# Patient Record
Sex: Female | Born: 1985
Health system: Southern US, Community
[De-identification: ages and names within clinical notes are randomized; demographics above are authoritative.]

## PROBLEM LIST (undated history)

## (undated) DIAGNOSIS — J45909 Unspecified asthma, uncomplicated: Secondary | ICD-10-CM

## (undated) DIAGNOSIS — R011 Cardiac murmur, unspecified: Secondary | ICD-10-CM

## (undated) HISTORY — PX: NO PAST SURGERIES: SHX2092

## (undated) HISTORY — DX: Unspecified asthma, uncomplicated: J45.909

## (undated) HISTORY — DX: Cardiac murmur, unspecified: R01.1

---
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
## 2009-11-27 ENCOUNTER — Ambulatory Visit: Payer: Self-pay | Admitting: Interventional Radiology

## 2009-11-27 ENCOUNTER — Emergency Department (HOSPITAL_BASED_OUTPATIENT_CLINIC_OR_DEPARTMENT_OTHER): Admission: EM | Admit: 2009-11-27 | Discharge: 2009-11-27 | Payer: Self-pay | Admitting: Emergency Medicine

## 2010-06-19 ENCOUNTER — Emergency Department (HOSPITAL_BASED_OUTPATIENT_CLINIC_OR_DEPARTMENT_OTHER)
Admission: EM | Admit: 2010-06-19 | Discharge: 2010-06-19 | Disposition: A | Discharge status: Home / Self Care | Payer: Self-pay | Attending: Emergency Medicine | Admitting: Emergency Medicine

## 2010-06-19 DIAGNOSIS — N898 Other specified noninflammatory disorders of vagina: Secondary | ICD-10-CM | POA: Insufficient documentation

## 2010-06-19 DIAGNOSIS — J45909 Unspecified asthma, uncomplicated: Secondary | ICD-10-CM | POA: Insufficient documentation

## 2010-06-19 LAB — PREGNANCY, URINE: Preg Test, Ur: NEGATIVE

## 2010-06-19 LAB — URINALYSIS, ROUTINE W REFLEX MICROSCOPIC
Nitrite: NEGATIVE
Specific Gravity, Urine: 1.03 (ref 1.005–1.030)
Urobilinogen, UA: 0.2 mg/dL (ref 0.0–1.0)
pH: 6 (ref 5.0–8.0)

## 2010-06-19 LAB — URINE MICROSCOPIC-ADD ON

## 2010-08-14 ENCOUNTER — Emergency Department: Payer: Self-pay | Admitting: Unknown Physician Specialty
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
## 2011-03-20 ENCOUNTER — Emergency Department (HOSPITAL_BASED_OUTPATIENT_CLINIC_OR_DEPARTMENT_OTHER)
Admission: EM | Admit: 2011-03-20 | Discharge: 2011-03-20 | Disposition: A | Discharge status: Home / Self Care | Payer: Self-pay | Attending: Emergency Medicine | Admitting: Emergency Medicine

## 2011-03-20 ENCOUNTER — Encounter (HOSPITAL_BASED_OUTPATIENT_CLINIC_OR_DEPARTMENT_OTHER): Payer: Self-pay | Admitting: *Deleted

## 2011-03-20 DIAGNOSIS — F17200 Nicotine dependence, unspecified, uncomplicated: Secondary | ICD-10-CM | POA: Insufficient documentation

## 2011-03-20 DIAGNOSIS — J029 Acute pharyngitis, unspecified: Secondary | ICD-10-CM | POA: Insufficient documentation

## 2011-03-20 MED ORDER — PENICILLIN V POTASSIUM 500 MG PO TABS: 500.0000 mg | ORAL_TABLET | Freq: Four times a day (QID) | ORAL | Status: AC

## 2011-03-20 NOTE — Discharge Instructions (Signed)
Sore Throat A sore throat is felt inside the throat and at the back of the mouth. It hurts to swallow or the throat may feel dry and scratchy. It can be caused by germs, smoking, pollution, or allergies.  HOME CARE   Only take medicine as told by your doctor.   Drink enough fluids to keep your pee (urine) clear or pale yellow.   Eat soft foods.   Do not smoke.   Rinse the mouth (gargle) with warm water or salt water ( teaspoon salt in 8 ounces of water).   Try throat sprays, lozenges, or suck on hard candy.  GET HELP RIGHT AWAY IF:   You have trouble breathing.   Your sore throat lasts longer than 1 week.   There is more puffiness (swelling) in the throat.   The pain is so bad that you are unable to swallow.   You have a very bad headache or a red rash.   You start to throw up (vomit).   You or your child has a temperature by mouth above 102 F (38.9 C), not controlled by medicine.   Your baby is older than 3 months with a rectal temperature of 102 F (38.9 C) or higher.   Your baby is 32 months old or younger with a rectal temperature of 100.4 F (38 C) or higher.  MAKE SURE YOU:   Understand these instructions.   Will watch your condition.   Will get help right away if you are not doing well or get worse.  Document Released: 10/15/2007 Document Revised: 09/17/2010 Document Reviewed: 10/15/2007 [facility] Patient Information 2012 [place], [place].Sore Throat A sore throat is felt inside the throat and at the back of the mouth. It hurts to swallow or the throat may feel dry and scratchy. It can be caused by germs, smoking, pollution, or allergies.  HOME CARE   Only take medicine as told by your doctor.   Drink enough fluids to keep your pee (urine) clear or pale yellow.   Eat soft foods.   Do not smoke.   Rinse the mouth (gargle) with warm water or salt water ( teaspoon salt in 8 ounces of water).   Try throat sprays, lozenges, or suck on hard candy.  GET  HELP RIGHT AWAY IF:   You have trouble breathing.   Your sore throat lasts longer than 1 week.   There is more puffiness (swelling) in the throat.   The pain is so bad that you are unable to swallow.   You have a very bad headache or a red rash.   You start to throw up (vomit).   You or your child has a temperature by mouth above 102 F (38.9 C), not controlled by medicine.   Your baby is older than 3 months with a rectal temperature of 102 F (38.9 C) or higher.   Your baby is 47 months old or younger with a rectal temperature of 100.4 F (38 C) or higher.  MAKE SURE YOU:   Understand these instructions.   Will watch your condition.   Will get help right away if you are not doing well or get worse.  Document Released: 10/15/2007 Document Revised: 09/17/2010 Document Reviewed: 10/15/2007 [facility] Patient Information 2012 [place], [place].

## 2011-03-20 NOTE — ED Provider Notes (Signed)
History     CSN: [id number]  Arrival date & time 03/20/11  1340   First MD Initiated Contact with Patient 03/20/11 1354      Chief Complaint  Patient presents with  . Sore Throat    (Consider location/radiation/quality/duration/timing/severity/associated sxs/prior treatment) Patient is a 26 y.o. female presenting with pharyngitis. The history is provided by the patient. No language interpreter was used.  Sore Throat This is a new problem. The current episode started in the past 7 days. The problem occurs constantly. The problem has been gradually worsening. The symptoms are aggravated by nothing. She has tried nothing for the symptoms. The treatment provided no relief.   Pt complains of a sorethroat.  Pt reports left tonsil is swollen History reviewed. No pertinent past medical history.  History reviewed. No pertinent past surgical history.  No family history on file.  History  Substance Use Topics  . Smoking status: Current Everyday Smoker -- 0.5 packs/day  . Smokeless tobacco: Not on file  . Alcohol Use: No    OB History    Grav Para Term Preterm Abortions TAB SAB Ect Mult Living                  Review of Systems  All other systems reviewed and are negative.    Allergies  Review of patient's allergies indicates no known allergies.  Home Medications  No current outpatient prescriptions on file.  BP 115/75  Pulse 86  Temp(Src) 98.2 F (36.8 C) (Oral)  Resp 18  Ht 5\' 6"  (1.676 m)  Wt 170 lb (77.111 kg)  BMI 27.44 kg/m2  SpO2 99%  Physical Exam  Vitals reviewed. Constitutional: She appears well-nourished.  HENT:  Head: Normocephalic and atraumatic.  Right Ear: External ear normal.  Left Ear: External ear normal.  Nose: Nose normal.       Swollen left tonsil,  Throat erythematous  Eyes: Conjunctivae and EOM are normal. Pupils are equal, round, and reactive to light.  Neck: Neck supple.  Pulmonary/Chest: Effort normal.  Abdominal: Soft.    Neurological: She is alert.  Skin: Skin is warm.    ED Course  Procedures (including critical care time)   Labs Reviewed  RAPID STREP SCREEN   No results found.   No diagnosis found.    MDM  Strep is negative,  I advised salt water gargles, rx for pcn.  Pt to see her Md for recheck on Monday       Langston Masker, [place] 03/20/11 1438

## 2011-03-20 NOTE — ED Notes (Signed)
Sore throat 2 weeks ago. Got better and 3 days ago came back.

## 2011-03-21 NOTE — ED Provider Notes (Signed)
Medical screening examination/treatment/procedure(s) were performed by non-physician practitioner and as supervising physician I was immediately available for consultation/collaboration.  Cyndra Numbers, MD 03/21/11 1021

## 2014-05-13 ENCOUNTER — Emergency Department
Admit: 2014-05-13 | Disposition: A | Discharge status: Home / Self Care | Payer: Self-pay | Admitting: Emergency Medicine
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
## 2016-05-16 ENCOUNTER — Ambulatory Visit
Admission: EM | Admit: 2016-05-16 | Discharge: 2016-05-16 | Disposition: A | Discharge status: Home / Self Care | Payer: Self-pay | Attending: Family Medicine | Admitting: Family Medicine

## 2016-05-16 DIAGNOSIS — J020 Streptococcal pharyngitis: Secondary | ICD-10-CM

## 2016-05-16 LAB — RAPID STREP SCREEN (MED CTR MEBANE ONLY): STREPTOCOCCUS, GROUP A SCREEN (DIRECT): POSITIVE — AB

## 2016-05-16 MED ORDER — PENICILLIN G BENZATHINE 1200000 UNIT/2ML IM SUSP
1.2000 10*6.[IU] | Freq: Once | INTRAMUSCULAR | Status: AC
  Administered 2016-05-16: 1.2 10*6.[IU] via INTRAMUSCULAR

## 2016-05-16 NOTE — ED Provider Notes (Signed)
CSN: [id number]     Arrival date & time 05/16/16  1526 History   First MD Initiated Contact with Patient 05/16/16 1552     Chief Complaint  Patient presents with  . Sore Throat   (Consider location/radiation/quality/duration/timing/severity/associated sxs/prior Treatment) HPI As is a 31 year old female who is accompanied by her mother presents with the sudden onset of a sore throat mostly on the left side and a fever that started this morning. It was as high as 101.6. Currently is 99.2.Pulse 110. She is having trouble swallowing.       History reviewed. No pertinent past medical history. History reviewed. No pertinent surgical history. Family History  Problem Relation Age of Onset  . Supraventricular tachycardia Mother   . Stroke Father    Social History  Substance Use Topics  . Smoking status: Current Every Day Smoker    Packs/day: 0.50  . Smokeless tobacco: Never Used  . Alcohol use Yes   OB History    No data available     Review of Systems  Constitutional: Positive for activity change, chills and fever.  HENT: Positive for sore throat and trouble swallowing.   Respiratory: Negative for cough.   All other systems reviewed and are negative.   Allergies  Patient has no known allergies.  Home Medications   Prior to Admission medications   Not on File   Meds Ordered and Administered this Visit   Medications  penicillin g benzathine (BICILLIN LA) 1200000 UNIT/2ML injection 1.2 Million Units (1.2 Million Units Intramuscular Given 05/16/16 1605)    BP (!) 142/68 (BP Location: Left Arm)   Pulse (!) 110   Temp 99.2 F (37.3 C) (Oral)   Resp 18   Ht  (1.651 m)   Wt 195 lb (88.5 kg)   LMP 04/25/2016   SpO2 99%   BMI 32.45 kg/m  No data found.   Physical Exam  Constitutional: She is oriented to person, place, and time. She appears well-developed and well-nourished. No distress.  HENT:  Head: Normocephalic.  Right Ear: External ear normal.  Left  Ear: External ear normal.  Left tonsil is enlarged with cobblestone appearance but no exudate is seen. Tenderness along the left throat and shotty nodes in anterior cervical chain.  Eyes: Pupils are equal, round, and reactive to light.  Neck: Normal range of motion.  Pulmonary/Chest: Effort normal and breath sounds normal.  Musculoskeletal: Normal range of motion.  Neurological: She is alert and oriented to person, place, and time.  Skin: Skin is warm and dry. She is not diaphoretic.  Psychiatric: She has a normal mood and affect. Her behavior is normal. Judgment and thought content normal.  Nursing note and vitals reviewed.   Urgent Care Course     Procedures (including critical care time)  Labs Review Labs Reviewed  RAPID STREP SCREEN (NOT AT [facility]) - Abnormal; Notable for the following:       Result Value   Streptococcus, Group A Screen (Direct) POSITIVE (*)    All other components within normal limits    Imaging Review No results found..ed   Visual Acuity Review  Right Eye Distance:   Left Eye Distance:   Bilateral Distance:    Right Eye Near:   Left Eye Near:    Bilateral Near:     Medications  penicillin g benzathine (BICILLIN LA) 1200000 UNIT/2ML injection 1.2 Million Units (1.2 Million Units Intramuscular Given 05/16/16 1605)      MDM   1. Strep throat  Plan: 1. Test/x-ray results and diagnosis reviewed with patient 2. rx as per orders; risks, benefits, potential side effects reviewed with patient 3. Recommend supportive treatment with Motrin for inflammation fever and pain.Salt water gargles or lozenges for comfort. Return to clinic if not feeling improvement. 4. F/u prn if symptomres worsen or don't improve     Lutricia Feil, PA-C 05/16/16 1614

## 2016-05-16 NOTE — ED Triage Notes (Signed)
Patient complains of a sore throat and fever that started this morning.

## 2016-12-01 DIAGNOSIS — A049 Bacterial intestinal infection, unspecified: Secondary | ICD-10-CM | POA: Diagnosis not present

## 2016-12-01 DIAGNOSIS — R197 Diarrhea, unspecified: Secondary | ICD-10-CM | POA: Diagnosis not present

## 2016-12-01 DIAGNOSIS — R1084 Generalized abdominal pain: Secondary | ICD-10-CM | POA: Diagnosis not present

## 2016-12-16 DIAGNOSIS — H5213 Myopia, bilateral: Secondary | ICD-10-CM | POA: Diagnosis not present

## 2017-04-12 DIAGNOSIS — S299XXA Unspecified injury of thorax, initial encounter: Secondary | ICD-10-CM | POA: Diagnosis not present

## 2017-04-26 ENCOUNTER — Ambulatory Visit
Admission: RE | Admit: 2017-04-26 | Discharge: 2017-04-26 | Disposition: A | Discharge status: Home / Self Care | Payer: BLUE CROSS/BLUE SHIELD | Source: Ambulatory Visit | Attending: Family Medicine | Admitting: Family Medicine

## 2017-04-26 ENCOUNTER — Ambulatory Visit: Payer: BLUE CROSS/BLUE SHIELD | Admitting: Family Medicine

## 2017-04-26 ENCOUNTER — Encounter: Payer: Self-pay | Admitting: Family Medicine

## 2017-04-26 ENCOUNTER — Telehealth: Payer: Self-pay

## 2017-04-26 VITALS — BP 122/78 | HR 85 | Temp 97.7°F | Resp 16 | Ht 65.0 in | Wt 208.0 lb

## 2017-04-26 DIAGNOSIS — S83411A Sprain of medial collateral ligament of right knee, initial encounter: Secondary | ICD-10-CM

## 2017-04-26 DIAGNOSIS — X58XXXA Exposure to other specified factors, initial encounter: Secondary | ICD-10-CM | POA: Diagnosis not present

## 2017-04-26 DIAGNOSIS — S8390XA Sprain of unspecified site of unspecified knee, initial encounter: Secondary | ICD-10-CM | POA: Insufficient documentation

## 2017-04-26 DIAGNOSIS — Z720 Tobacco use: Secondary | ICD-10-CM | POA: Insufficient documentation

## 2017-04-26 DIAGNOSIS — J45990 Exercise induced bronchospasm: Secondary | ICD-10-CM | POA: Diagnosis not present

## 2017-04-26 DIAGNOSIS — M25561 Pain in right knee: Secondary | ICD-10-CM | POA: Diagnosis not present

## 2017-04-26 NOTE — Assessment & Plan Note (Signed)
Discussed health risks of continued smoking, importance of cessation Patient not interested in quitting at this time Continue to assess

## 2017-04-26 NOTE — Telephone Encounter (Signed)
Left message advising pt. OK per DPR. 

## 2017-04-26 NOTE — Assessment & Plan Note (Signed)
Currently well controlled without need for albuterol or other medications Continue to monitor

## 2017-04-26 NOTE — Progress Notes (Signed)
Patient: Amanda Nichols, Female    DOB: Sep 01, 1985, 32 y.o.   MRN: [id number] Visit Date: 04/26/2017  Today's Provider: Shirlee Latch, MD   I, Joslyn Hy, CMA, am acting as scribe for Shirlee Latch, MD.  Chief Complaint  Patient presents with  . Establish Care  . Knee Pain   Subjective:    Establish Care Amanda Nichols is a 32 y.o. female who presents today to establish care. She feels fairly well. She reports exercising some, states she walks about 1 mile per day to her horse stall. She reports she is sleeping well.  She was diagnosed with exercise-induced asthma when she was younger.  She used to run track and play basketball.  She would take her albuterol inhaler as needed during activity.  She never had to take a controller medication.  She has not needed albuterol for many years and she is less active than she used to be.  Pt is also c/o right knee pain. This has been present for 1 week. She has tried a brace, ice, heat, IBU all without relief. The knee is swelling x3d. She denies any known injury to the knee, but believes she could have injured it about 3 weeks ago when she injured her chest wall while building a horse stall.  Pain is present in the anterior medial knee.  She finds that her knee gets stiff after sitting for long periods of time.  It is worse with crossing her legs, squatting and hyperextending her leg.  She has been smoking about half pack of cigarettes a day for the last 4 years.  She is not currently interested in quitting.  She knows the consequences of smoking including increased cancer risk, heart disease risk, lung disease risk etc.  She thinks that she may try to quit in the future. -----------------------------------------------------------------   Review of Systems  Constitutional: Negative.   HENT: Negative.   Eyes: Negative.   Respiratory: Negative.   Cardiovascular: Negative.   Gastrointestinal: Negative.     Endocrine: Negative.   Genitourinary: Negative.   Musculoskeletal: Positive for joint swelling. Negative for arthralgias, back pain, gait problem, myalgias, neck pain and neck stiffness.  Skin: Negative.   Allergic/Immunologic: Negative.   Neurological: Negative.   Hematological: Negative.   Psychiatric/Behavioral: Negative.     Social History      She  reports that she has been smoking.  She has been smoking about 0.50 packs per day. She has never used smokeless tobacco. She reports that she drinks alcohol. She reports that she does not use drugs.       Social History   Socioeconomic History  . Marital status: Single    Spouse name: Not on file  . Number of children: 0  . Years of education: 16  . Highest education level: Bachelor's degree (e.g., BA, AB, BS)  Occupational History    Employer: [employer]  Social Needs  . Financial resource strain: Not hard at all  . Food insecurity:    Worry: Never true    Inability: Never true  . Transportation needs:    Medical: No    Non-medical: No  Tobacco Use  . Smoking status: Current Every Day Smoker    Packs/day: 0.50  . Smokeless tobacco: Never Used  . Tobacco comment: started smoking in 2015  Substance and Sexual Activity  . Alcohol use: Yes    Comment: occasional  . Drug use: No  . Sexual activity:  Not on file  Lifestyle  . Physical activity:    Days per week: 7 days    Minutes per session: 10 min  . Stress: Not on file  Relationships  . Social connections:    Talks on phone: Not on file    Gets together: Not on file    Attends religious service: Not on file    Active member of club or organization: Not on file    Attends meetings of clubs or organizations: Not on file    Relationship status: Not on file  Other Topics Concern  . Not on file  Social History Narrative  . Not on file    Past Medical History:  Diagnosis Date  . Asthma   . Heart murmur      There are no active problems to  display for this patient.   Past Surgical History:  Procedure Laterality Date  . NO PAST SURGERIES      Family History        Family Status  Relation Name Status  . Mother  Alive  . Father  Alive  . Mat Uncle  (Not Specified)  . MGF  (Not Specified)  . Brother  Alive  . Neg Hx  (Not Specified)        Her family history includes Cataracts in her maternal grandfather; Colon polyps in her maternal grandfather; Healthy in her brother; Lymphoma in her maternal grandfather; Melanoma in her maternal grandfather; Sleep apnea in her maternal uncle; Stroke in her father; Supraventricular tachycardia in her mother. There is no history of Colon cancer, Breast cancer, Ovarian cancer, or Cervical cancer.      No Known Allergies   Current Outpatient Medications:  .  ibuprofen (ADVIL,MOTRIN) 200 MG tablet, Take 200 mg by mouth every 6 (six) hours as needed., Disp: , Rfl:    Patient Care Team: Patient, No Pcp Per as PCP - General (General Practice)      Objective:   Vitals: BP 122/78 (BP Location: Left Arm, Patient Position: Sitting, Cuff Size: Large)   Pulse 85   Temp 97.7 F (36.5 C) (Oral)   Resp 16   Ht 5\' 5"  (1.651 m)   Wt 208 lb (94.3 kg)   LMP 04/07/2017   SpO2 98%   BMI 34.61 kg/m    Vitals:   04/26/17 0913  BP: 122/78  Pulse: 85  Resp: 16  Temp: 97.7 F (36.5 C)  TempSrc: Oral  SpO2: 98%  Weight: 208 lb (94.3 kg)  Height: 5\' 5"  (1.651 m)     Physical Exam  Constitutional: She is oriented to person, place, and time. She appears well-developed and well-nourished. No distress.  HENT:  Head: Normocephalic and atraumatic.  Right Ear: External ear normal.  Left Ear: External ear normal.  Nose: Nose normal.  Mouth/Throat: Oropharynx is clear and moist.  Eyes: Pupils are equal, round, and reactive to light. Conjunctivae and EOM are normal. No scleral icterus.  Neck: Neck supple. No thyromegaly present.  Cardiovascular: Normal rate, regular rhythm, normal heart  sounds and intact distal pulses.  No murmur heard. Pulmonary/Chest: Breath sounds normal. No respiratory distress. She has no wheezes. She has no rales.  Abdominal: Soft. Bowel sounds are normal. She exhibits no distension. There is no tenderness. There is no rebound and no guarding.  Musculoskeletal: Normal range of motion. She exhibits no edema or deformity.       Right knee: She exhibits normal range of motion, no swelling, no effusion, no  ecchymosis, no deformity, no erythema, normal alignment, no LCL laxity, normal patellar mobility and no MCL laxity. Tenderness found. Medial joint line and MCL tenderness noted. No lateral joint line, no LCL and no patellar tendon tenderness noted.  Ligaments with solid endpoints.  Negative anterior and posterior drawer.  Negative Thessaly's.  Lymphadenopathy:    She has no cervical adenopathy.  Neurological: She is alert and oriented to person, place, and time.  Skin: Skin is warm and dry. No rash noted.  Psychiatric: She has a normal mood and affect. Her behavior is normal.  Vitals reviewed.    Depression Screen PHQ 2/9 Scores 04/26/2017  PHQ - 2 Score 0      Assessment & Plan:    Problem List Items Addressed This Visit      Respiratory   Exercise-induced asthma    Currently well controlled without need for albuterol or other medications Continue to monitor        Musculoskeletal and Integument   Knee sprain - Primary    Exam consistent with medial knee sprain Given ability to perform Thessaly's and no knee instability, doubt meniscal injury, especially without known mechanism of injury Obtain x-rays Advised on bracing, ice, rest, anti-inflammatories Home exercise program given Return precautions discussed If continues to be bothersome or worsens, consider orthopedic referral      Relevant Orders   DG Knee Complete 4 Views Right (Completed)     Other   Tobacco abuse    Discussed health risks of continued smoking, importance of  cessation Patient not interested in quitting at this time Continue to assess          Return in about 6 months (around 10/26/2017) for physical.   The entirety of the information documented in the History of Present Illness, Review of Systems and Physical Exam were personally obtained by me. Portions of this information were initially documented by Irving Burton Ratchford, CMA and reviewed by me for thoroughness and accuracy.    Erasmo Downer, MD, MPH [facility] 04/26/2017 10:22 AM

## 2017-04-26 NOTE — Assessment & Plan Note (Signed)
Exam consistent with medial knee sprain Given ability to perform Thessaly's and no knee instability, doubt meniscal injury, especially without known mechanism of injury Obtain x-rays Advised on bracing, ice, rest, anti-inflammatories Home exercise program given Return precautions discussed If continues to be bothersome or worsens, consider orthopedic referral

## 2017-04-26 NOTE — Telephone Encounter (Signed)
-----   Message from Erasmo DownerAngela M Bacigalupo, MD sent at 04/26/2017 10:23 AM EDT ----- Normal knee Lottie RaterXRays  Bacigalupo, Angela M, MD, MPH [facility] 04/26/2017 10:23 AM

## 2017-04-30 ENCOUNTER — Telehealth: Payer: Self-pay | Admitting: Family Medicine

## 2017-04-30 DIAGNOSIS — S83411A Sprain of medial collateral ligament of right knee, initial encounter: Secondary | ICD-10-CM

## 2017-04-30 NOTE — Telephone Encounter (Signed)
Patient is requesting an orthopedic referral to Dr. Eulah PontMurphy, Dr. Thurston HoleWainer or Dr. Dion SaucierLandau for her knee pain.  She said its not any better and in fact "its worse".

## 2017-04-30 NOTE — Telephone Encounter (Signed)
Pt advised.

## 2017-04-30 NOTE — Telephone Encounter (Signed)
Please advise. Thanks.  

## 2017-04-30 NOTE — Telephone Encounter (Signed)
Referral placed. Let patient know that she will be contacted about scheduling an appt, likely some time next week.  Erasmo DownerBacigalupo, Angela M, MD, MPH [facility] 04/30/2017 11:35 AM

## 2017-11-10 ENCOUNTER — Encounter: Payer: Self-pay | Admitting: Family Medicine

## 2018-05-25 ENCOUNTER — Telehealth: Payer: Self-pay

## 2018-05-25 NOTE — Telephone Encounter (Signed)
LMTCB for PHQ screening 

## 2018-07-02 IMAGING — CR DG KNEE COMPLETE 4+V*R*
1 series · 4 of 4 positions shown · non-contrast
Comparison: None.

CLINICAL DATA: Pain for 1 week

EXAM:
RIGHT KNEE - COMPLETE 4+ VIEW

[Series 1: dg knee complete 4 views right · 0.14mm/px · 4 of 4 slices shown]
[im 1/4]
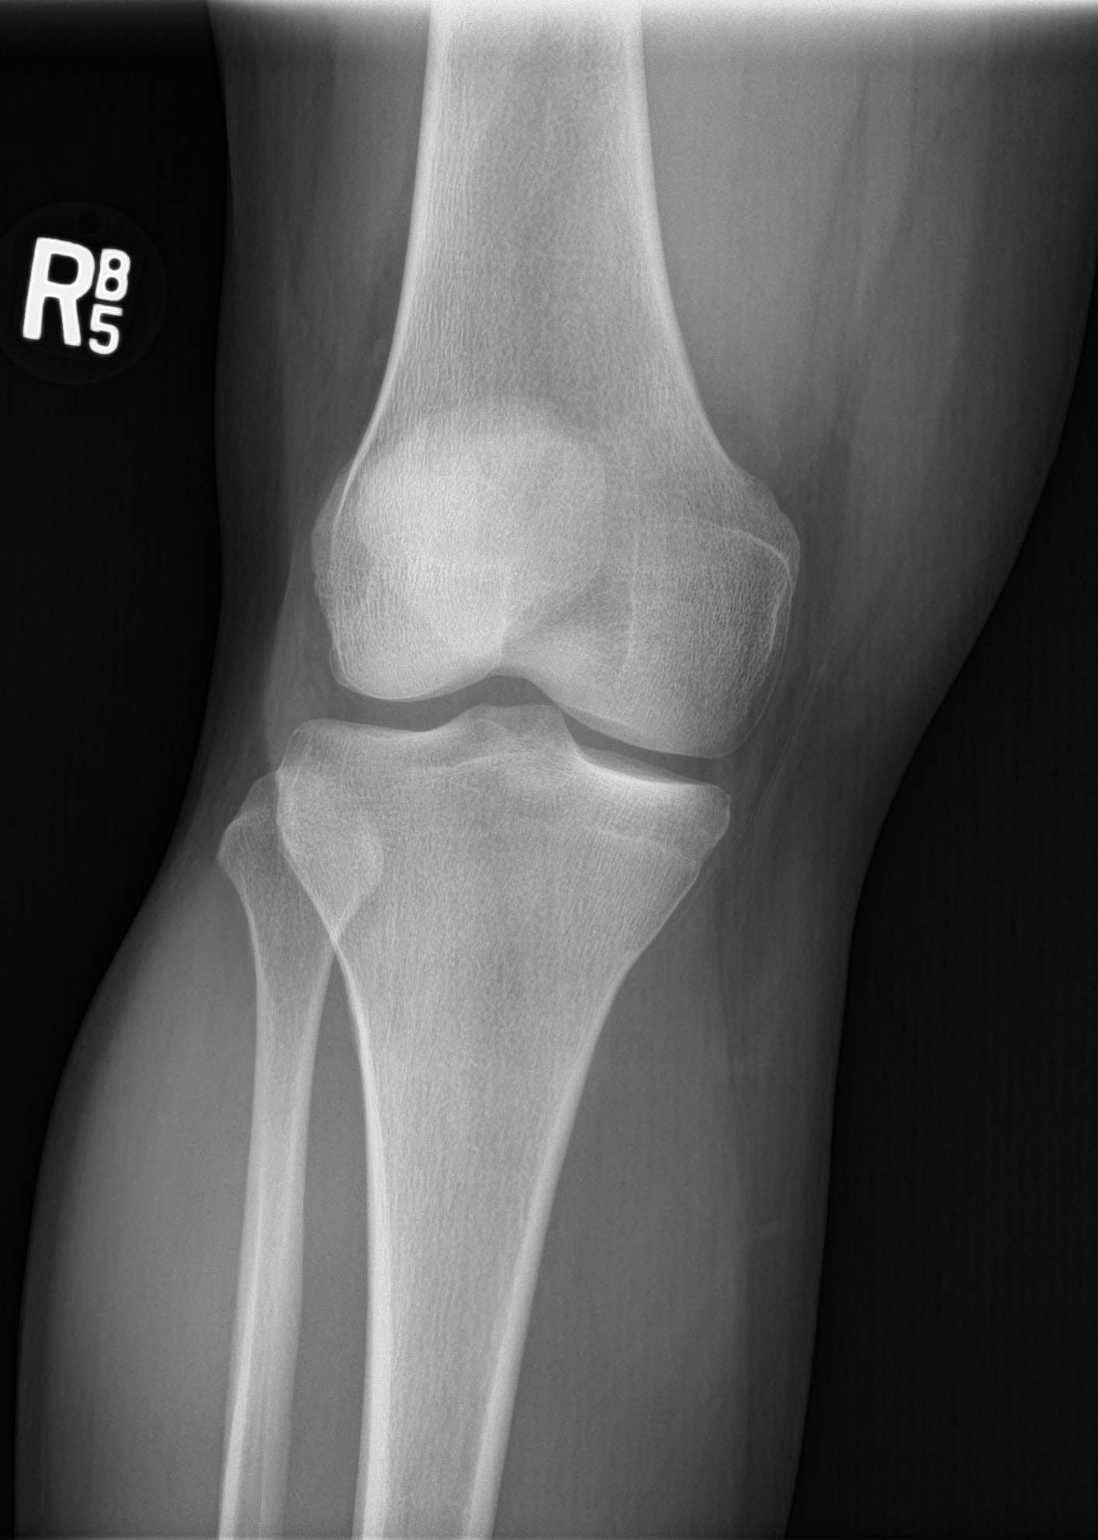
[im 2/4]
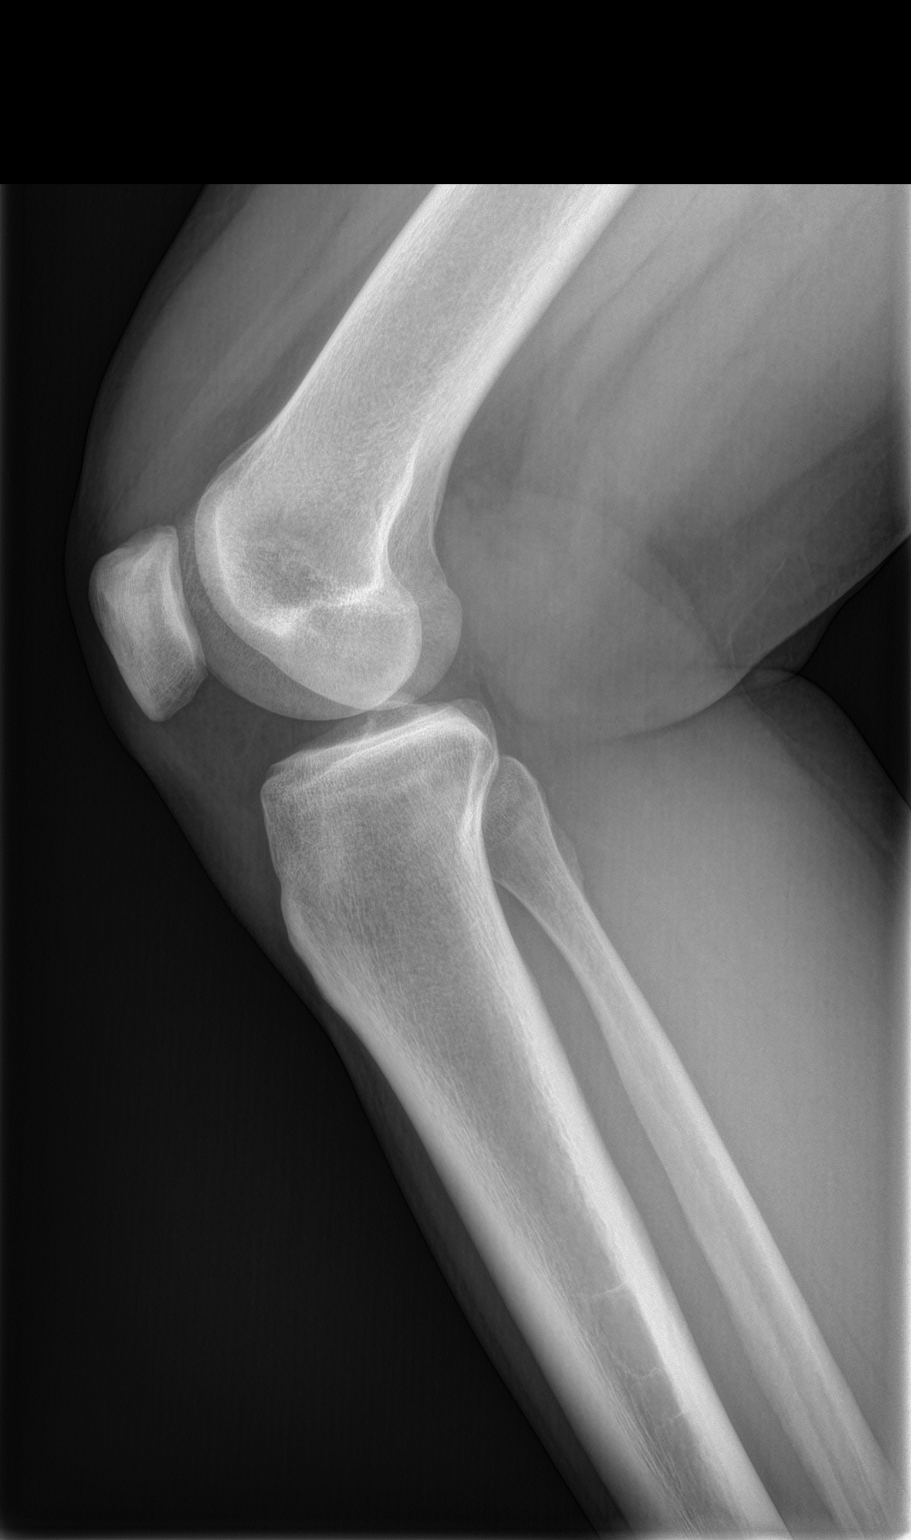
[im 3/4]
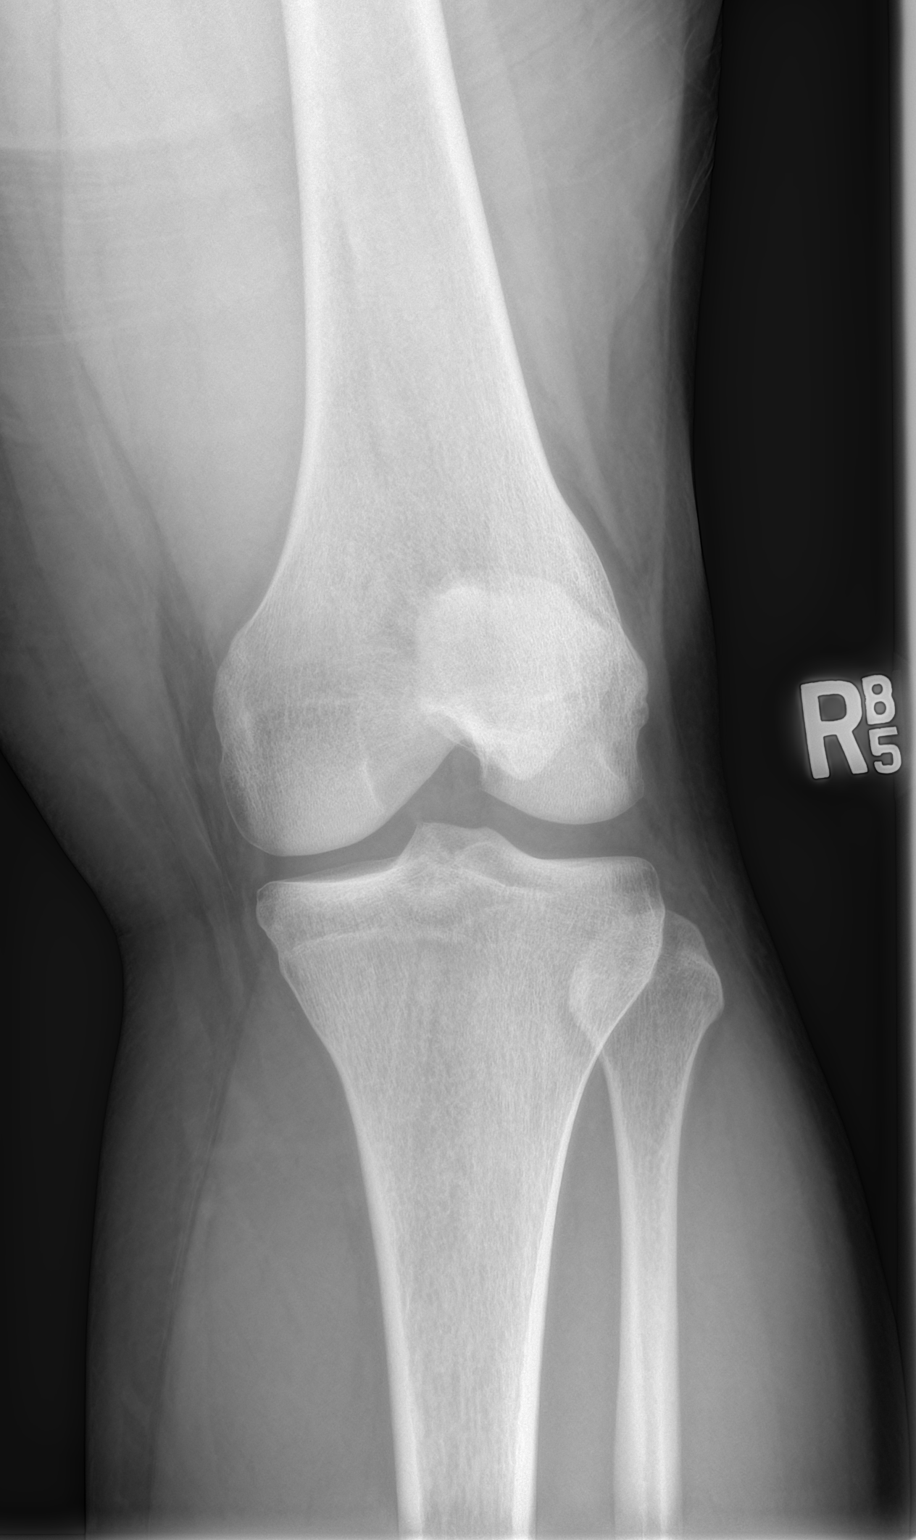
[im 4/4]
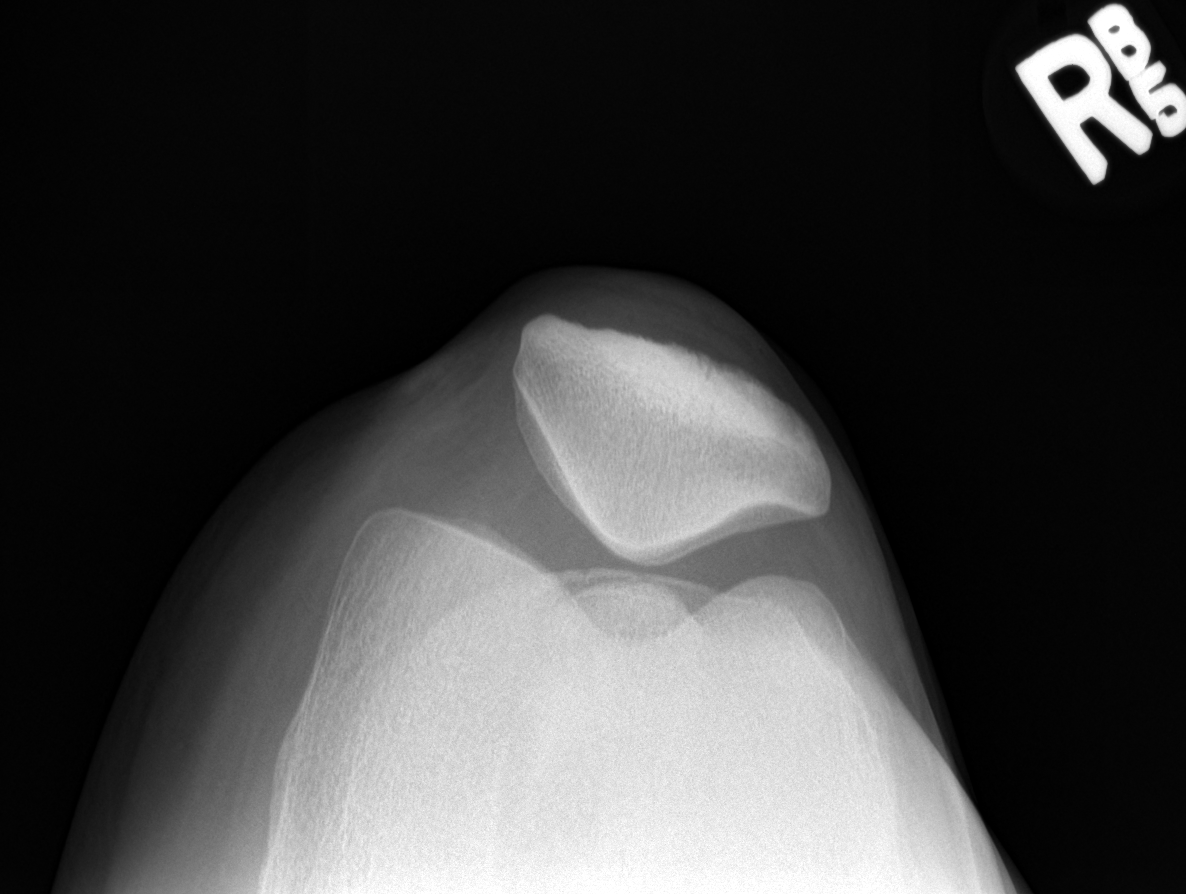

[4 of 4 positions shown; findings below may reference images not displayed]

FINDINGS: Frontal, tunnel, lateral, and sunrise patellar images obtained.
There is no evident fracture or dislocation. No joint effusion. The
joint spaces appear normal. No erosive change.
IMPRESSION: No fracture or joint effusion.  No evident arthropathy.

## 2018-08-24 ENCOUNTER — Other Ambulatory Visit: Payer: Self-pay

## 2018-08-24 DIAGNOSIS — Z20822 Contact with and (suspected) exposure to covid-19: Secondary | ICD-10-CM

## 2018-08-25 LAB — NOVEL CORONAVIRUS, NAA: SARS-CoV-2, NAA: NOT DETECTED

## 2018-09-02 ENCOUNTER — Emergency Department
Admission: EM | Admit: 2018-09-02 | Discharge: 2018-09-02 | Disposition: A | Discharge status: Home / Self Care | Payer: No Typology Code available for payment source | Attending: Emergency Medicine | Admitting: Emergency Medicine

## 2018-09-02 ENCOUNTER — Other Ambulatory Visit: Payer: Self-pay

## 2018-09-02 DIAGNOSIS — Z2914 Encounter for prophylactic rabies immune globin: Secondary | ICD-10-CM | POA: Diagnosis not present

## 2018-09-02 DIAGNOSIS — Y92531 Health care provider office as the place of occurrence of the external cause: Secondary | ICD-10-CM | POA: Insufficient documentation

## 2018-09-02 DIAGNOSIS — F17210 Nicotine dependence, cigarettes, uncomplicated: Secondary | ICD-10-CM | POA: Diagnosis not present

## 2018-09-02 DIAGNOSIS — Y990 Civilian activity done for income or pay: Secondary | ICD-10-CM | POA: Insufficient documentation

## 2018-09-02 DIAGNOSIS — W5501XA Bitten by cat, initial encounter: Secondary | ICD-10-CM | POA: Diagnosis not present

## 2018-09-02 DIAGNOSIS — Z203 Contact with and (suspected) exposure to rabies: Secondary | ICD-10-CM

## 2018-09-02 DIAGNOSIS — J45909 Unspecified asthma, uncomplicated: Secondary | ICD-10-CM | POA: Diagnosis not present

## 2018-09-02 DIAGNOSIS — S61452A Open bite of left hand, initial encounter: Secondary | ICD-10-CM | POA: Diagnosis not present

## 2018-09-02 DIAGNOSIS — Z23 Encounter for immunization: Secondary | ICD-10-CM | POA: Insufficient documentation

## 2018-09-02 DIAGNOSIS — Y93K9 Activity, other involving animal care: Secondary | ICD-10-CM | POA: Insufficient documentation

## 2018-09-02 MED ORDER — RABIES IMMUNE GLOBULIN 150 UNIT/ML IM INJ
20.0000 [IU]/kg | INJECTION | Freq: Once | INTRAMUSCULAR | Status: AC
  Administered 2018-09-02: 12:00:00 1650 [IU] via INTRAMUSCULAR
  Filled 2018-09-02: qty 11

## 2018-09-02 MED ORDER — AMOXICILLIN-POT CLAVULANATE 875-125 MG PO TABS: 1.0000 | ORAL_TABLET | Freq: Two times a day (BID) | ORAL | 0 refills | Status: DC

## 2018-09-02 MED ORDER — RABIES VACCINE, PCEC IM SUSR
1.0000 mL | Freq: Once | INTRAMUSCULAR | Status: AC
  Administered 2018-09-02: 1 mL via INTRAMUSCULAR
  Filled 2018-09-02: qty 1

## 2018-09-02 MED ORDER — RABIES IMMUNE GLOBULIN 150 UNIT/ML IM INJ: 20.0000 [IU]/kg | INJECTION | Freq: Once | INTRAMUSCULAR | Status: DC

## 2018-09-02 NOTE — ED Notes (Signed)
See triage note  Presents with cat bite to hand on Tuesday   States this was unprovoked bite   States the cat was tested for possible rabies    She was coupleof open wounds to hand

## 2018-09-02 NOTE — ED Triage Notes (Signed)
Pt was bitten by cat at work Tuesday to right and left hand. Questionable if cat is up to date on rabies shot, currently being tested.  Pt wants to file workers comp-works for [employer] alert and oriented X4, active, cooperative, pt in NAD. RR even and unlabored, color WNL.

## 2018-09-02 NOTE — ED Provider Notes (Signed)
[facility] Emergency Department Provider Note  ____________________________________________  Time seen: Approximately 11:04 AM  I have reviewed the triage vital signs and the nursing notes.   HISTORY  Chief Complaint Animal Bite   HPI Amanda Nichols is a 33 y.o. female who presents to the emergency department for treatment and evaluation of hand pain after being bit by a cat while at work. Right fifth digit is swollen. Bite on left hand seems to be healing well. Cat was tested for Rabies. Results are not back. Some relief with ibuprofen. Incident occurred on Tuesday.   Past Medical History:  Diagnosis Date  . Asthma   . Heart murmur     Patient Active Problem List   Diagnosis Date Noted  . Knee sprain 04/26/2017  . Exercise-induced asthma 04/26/2017  . Tobacco abuse 04/26/2017    Past Surgical History:  Procedure Laterality Date  . NO PAST SURGERIES      Prior to Admission medications   Medication Sig Start Date End Date Taking? Authorizing Provider  amoxicillin-clavulanate (AUGMENTIN) 875-125 MG tablet Take 1 tablet by mouth 2 (two) times daily. 09/02/18   Tyqwan Pink B, FNP  ibuprofen (ADVIL,MOTRIN) 200 MG tablet Take 400 mg by mouth every 6 (six) hours as needed.    [provider]    Allergies Patient has no known allergies.  Family History  Problem Relation Age of Onset  . Supraventricular tachycardia Mother   . Hypertension Mother   . Stroke Father   . Sleep apnea Maternal Uncle   . Lymphoma Maternal Grandfather   . Melanoma Maternal Grandfather   . Cataracts Maternal Grandfather   . Colon polyps Maternal Grandfather   . Healthy Brother   . Colon cancer Neg Hx   . Breast cancer Neg Hx   . Ovarian cancer Neg Hx   . Cervical cancer Neg Hx     Social History Social History   Tobacco Use  . Smoking status: Current Every Day Smoker    Packs/day: 0.50    Years: 4.00    Pack years: 2.00    Types: Cigarettes   . Smokeless tobacco: Never Used  . Tobacco comment: started smoking in 2015  Substance Use Topics  . Alcohol use: Yes    Comment: occasional, ~1x/ month  . Drug use: No    Review of Systems  Constitutional: Negative for fever. Respiratory: Negative for cough or shortness of breath.  Musculoskeletal: Negative for myalgias Skin: Positive for wounds to left and right hand. Neurological: Negative for numbness or paresthesias. ____________________________________________   PHYSICAL EXAM:  VITAL SIGNS: ED Triage Vitals  Enc Vitals Group     BP 09/02/18 1027 (!) 136/93     Pulse Rate 09/02/18 1027 73     Resp 09/02/18 1027 18     Temp 09/02/18 1027 98 F (36.7 C)     Temp Source 09/02/18 1027 Oral     SpO2 09/02/18 1027 100 %     Weight 09/02/18 1028 180 lb (81.6 kg)     Height 09/02/18 1028 5\' 5"  (1.651 m)     Head Circumference --      Peak Flow --      Pain Score 09/02/18 1028 0     Pain Loc --      Pain Edu? --      Excl. in [place]? --      Constitutional: Well appearing. Eyes: Conjunctivae are clear without discharge or drainage. Nose: No rhinorrhea noted. Mouth/Throat:  Airway is patent.  Neck: No stridor. Unrestricted range of motion observed. Cardiovascular: Capillary refill is <3 seconds.  Respiratory: Respirations are even and unlabored.. Musculoskeletal: Unrestricted range of motion observed. Neurologic: Awake, alert, and oriented x 4.  Skin: Small puncture wound on the left  ____________________________________________   LABS (all labs ordered are listed, but only abnormal results are displayed)  Labs Reviewed - No data to display ____________________________________________  EKG  Not indicated. ____________________________________________  RADIOLOGY   ____________________________________________   PROCEDURES  Procedures ____________________________________________   INITIAL IMPRESSION / ASSESSMENT AND PLAN / ED COURSE  Amanda MoatsLatasha Nicole  Nichols is a 33 y.o. female who presents to the emergency department after being bitten by a cat 3 days ago.  Patient states that the day she was bitten by the cat it was acting fairly normal, but the owner brought the cat back in the next day and it was acting crazy.  She states that it had gone blind and was having seizures and unable to control its movements.  She will be treated with rabies immunoglobulin and the vaccination.  For the actual puncture wounds, she will be treated with Augmentin.  She was provided the schedule for the return dates for the remainder of the vaccinations.  Medications  rabies vaccine (RABAVERT) injection 1 mL (1 mL Intramuscular Given 09/02/18 1157)  rabies immune globulin (HYPERAB/KEDRAB) injection 1,650 Units (1,650 Units Intramuscular Given 09/02/18 1156)     Pertinent labs & imaging results that were available during my care of the patient were reviewed by me and considered in my medical decision making (see chart for details).  ____________________________________________   FINAL CLINICAL IMPRESSION(S) / ED DIAGNOSES  Final diagnoses:  Cat bite, initial encounter  Need for post exposure prophylaxis for rabies    ED Discharge Orders         Ordered    amoxicillin-clavulanate (AUGMENTIN) 875-125 MG tablet  2 times daily     09/02/18 1257           Note:  This document was prepared using Dragon voice recognition software and may include unintentional dictation errors.   Chinita Pesterriplett, Emony Dormer B, FNP 09/02/18 1523    Sharyn CreamerQuale, Mark, MD 09/02/18 [phone]

## 2018-09-02 NOTE — Discharge Instructions (Addendum)
You will need to return on the 17, 21, 28 for the remainder of the Rabies series.

## 2018-09-05 ENCOUNTER — Emergency Department
Admission: EM | Admit: 2018-09-05 | Discharge: 2018-09-05 | Disposition: A | Discharge status: Home / Self Care | Payer: No Typology Code available for payment source | Attending: Student in an Organized Health Care Education/Training Program | Admitting: Student in an Organized Health Care Education/Training Program

## 2018-09-05 ENCOUNTER — Encounter: Payer: Self-pay | Admitting: Emergency Medicine

## 2018-09-05 ENCOUNTER — Other Ambulatory Visit: Payer: Self-pay

## 2018-09-05 DIAGNOSIS — Z23 Encounter for immunization: Secondary | ICD-10-CM | POA: Insufficient documentation

## 2018-09-05 DIAGNOSIS — J45909 Unspecified asthma, uncomplicated: Secondary | ICD-10-CM | POA: Diagnosis not present

## 2018-09-05 DIAGNOSIS — F17210 Nicotine dependence, cigarettes, uncomplicated: Secondary | ICD-10-CM | POA: Insufficient documentation

## 2018-09-05 MED ORDER — RABIES VACCINE, PCEC IM SUSR
1.0000 mL | Freq: Once | INTRAMUSCULAR | Status: AC
  Administered 2018-09-05: 18:00:00 1 mL via INTRAMUSCULAR
  Filled 2018-09-05: qty 1

## 2018-09-05 NOTE — ED Provider Notes (Signed)
[facility] Emergency Department Provider Note ____________________________________________  Time seen: 1735  I have reviewed the triage vital signs and the nursing notes.  HISTORY  Chief Complaint  Rabies Injection  HPI Amanda Nichols is a 33 y.o. female presents to the ED for her second of 4 rabies vaccines.  Patient denies any interim complaints.  Past Medical History:  Diagnosis Date  . Asthma   . Heart murmur     Patient Active Problem List   Diagnosis Date Noted  . Knee sprain 04/26/2017  . Exercise-induced asthma 04/26/2017  . Tobacco abuse 04/26/2017    Past Surgical History:  Procedure Laterality Date  . NO PAST SURGERIES      Prior to Admission medications   Medication Sig Start Date End Date Taking? Authorizing Provider  amoxicillin-clavulanate (AUGMENTIN) 875-125 MG tablet Take 1 tablet by mouth 2 (two) times daily. 09/02/18   Triplett, Cari B, FNP  ibuprofen (ADVIL,MOTRIN) 200 MG tablet Take 400 mg by mouth every 6 (six) hours as needed.    [provider]    Allergies Patient has no known allergies.  Family History  Problem Relation Age of Onset  . Supraventricular tachycardia Mother   . Hypertension Mother   . Stroke Father   . Sleep apnea Maternal Uncle   . Lymphoma Maternal Grandfather   . Melanoma Maternal Grandfather   . Cataracts Maternal Grandfather   . Colon polyps Maternal Grandfather   . Healthy Brother   . Colon cancer Neg Hx   . Breast cancer Neg Hx   . Ovarian cancer Neg Hx   . Cervical cancer Neg Hx     Social History Social History   Tobacco Use  . Smoking status: Current Every Day Smoker    Packs/day: 0.50    Years: 4.00    Pack years: 2.00    Types: Cigarettes  . Smokeless tobacco: Never Used  . Tobacco comment: started smoking in 2015  Substance Use Topics  . Alcohol use: Yes    Comment: occasional, ~1x/ month  . Drug use: No    Review of Systems  Constitutional: Negative  for fever. Eyes: Negative for visual changes. ENT: Negative for sore throat. Cardiovascular: Negative for chest pain. Respiratory: Negative for shortness of breath. Gastrointestinal: Negative for abdominal pain, vomiting and diarrhea. Genitourinary: Negative for dysuria. Musculoskeletal: Negative for back pain. Skin: Negative for rash. Neurological: Negative for headaches, focal weakness or numbness. ____________________________________________  PHYSICAL EXAM:  VITAL SIGNS: ED Triage Vitals  Enc Vitals Group     BP 09/05/18 1726 137/68     Pulse Rate 09/05/18 1726 75     Resp 09/05/18 1726 18     Temp 09/05/18 1726 98 F (36.7 C)     Temp Source 09/05/18 1726 Oral     SpO2 09/05/18 1726 100 %     Weight --      Height --      Head Circumference --      Peak Flow --      Pain Score 09/05/18 1713 0     Pain Loc --      Pain Edu? --      Excl. in [place]? --     Constitutional: Alert and oriented. Well appearing and in no distress. Head: Normocephalic and atraumatic. Eyes: Conjunctivae are normal. Normal extraocular movements Cardiovascular: Normal rate, regular rhythm. Normal distal pulses. Respiratory: Normal respiratory effort. No wheezes/rales/rhonchi. Musculoskeletal: Nontender with normal range of motion in all extremities.  Neurologic:  Normal gait without ataxia. Normal speech and language. No gross focal neurologic deficits are appreciated. Skin:  Skin is warm, dry and intact. No rash noted. Psychiatric: Mood and affect are normal. Patient exhibits appropriate insight and judgment. ____________________________________________  PROCEDURES  Procedures Rabavert 1 ml IM ____________________________________________  INITIAL IMPRESSION / ASSESSMENT AND PLAN / ED COURSE  Amanda Nichols was evaluated in Emergency Department on 09/05/2018 for the symptoms described in the history of present illness. She was evaluated in the context of the global COVID-19 pandemic,  which necessitated consideration that the patient might be at risk for infection with the SARS-CoV-2 virus that causes COVID-19. Institutional protocols and algorithms that pertain to the evaluation of patients at risk for COVID-19 are in a state of rapid change based on information released by regulatory bodies including the CDC and federal and state organizations. These policies and algorithms were followed during the patient's care in the ED.  Patient with subsequent ED visit for continuation of the rabies vaccine series.  She denies any interim complaints.  She received her second of 4 vaccines today patient will return to the ED as scheduled for the remainder of the series. ____________________________________________  FINAL CLINICAL IMPRESSION(S) / ED DIAGNOSES  Final diagnoses:  Encounter for repeat administration of rabies vaccination      Lissa HoardMenshew, Anwyn Kriegel V Bacon, PA-C 09/05/18 1740    Willy Eddyobinson, Patrick, MD 09/05/18 1816

## 2018-09-05 NOTE — ED Triage Notes (Signed)
Here for rabies vaccine

## 2018-09-05 NOTE — Discharge Instructions (Addendum)
Return to the ED as scheduled for your 3rd vaccine.

## 2018-09-09 ENCOUNTER — Other Ambulatory Visit: Payer: Self-pay

## 2018-09-09 ENCOUNTER — Emergency Department
Admission: EM | Admit: 2018-09-09 | Discharge: 2018-09-09 | Disposition: A | Discharge status: Home / Self Care | Payer: BC Managed Care – PPO | Attending: Emergency Medicine | Admitting: Emergency Medicine

## 2018-09-09 DIAGNOSIS — F17210 Nicotine dependence, cigarettes, uncomplicated: Secondary | ICD-10-CM | POA: Insufficient documentation

## 2018-09-09 DIAGNOSIS — J45909 Unspecified asthma, uncomplicated: Secondary | ICD-10-CM | POA: Diagnosis not present

## 2018-09-09 DIAGNOSIS — Z203 Contact with and (suspected) exposure to rabies: Secondary | ICD-10-CM | POA: Diagnosis not present

## 2018-09-09 DIAGNOSIS — Z23 Encounter for immunization: Secondary | ICD-10-CM | POA: Diagnosis not present

## 2018-09-09 MED ORDER — RABIES VACCINE, PCEC IM SUSR
1.0000 mL | Freq: Once | INTRAMUSCULAR | Status: AC
  Administered 2018-09-09: 1 mL via INTRAMUSCULAR
  Filled 2018-09-09: qty 1

## 2018-09-09 NOTE — ED Provider Notes (Signed)
[facility] Emergency Department Provider Note  ____________________________________________  Time seen: Approximately 5:34 PM  I have reviewed the triage vital signs and the nursing notes.   HISTORY  Chief Complaint Rabies Injection    HPI Amanda Nichols is a 33 y.o. female presents to the emergency department for her third rabies vaccine.  Patient has experienced no complications and has no concerns.  She anticipates having her final rabies vaccine 7 days from today.          Past Medical History:  Diagnosis Date  . Asthma   . Heart murmur     Patient Active Problem List   Diagnosis Date Noted  . Knee sprain 04/26/2017  . Exercise-induced asthma 04/26/2017  . Tobacco abuse 04/26/2017    Past Surgical History:  Procedure Laterality Date  . NO PAST SURGERIES      Prior to Admission medications   Medication Sig Start Date End Date Taking? Authorizing Provider  amoxicillin-clavulanate (AUGMENTIN) 875-125 MG tablet Take 1 tablet by mouth 2 (two) times daily. 09/02/18   Triplett, Cari B, FNP  ibuprofen (ADVIL,MOTRIN) 200 MG tablet Take 400 mg by mouth every 6 (six) hours as needed.    [provider]    Allergies Patient has no known allergies.  Family History  Problem Relation Age of Onset  . Supraventricular tachycardia Mother   . Hypertension Mother   . Stroke Father   . Sleep apnea Maternal Uncle   . Lymphoma Maternal Grandfather   . Melanoma Maternal Grandfather   . Cataracts Maternal Grandfather   . Colon polyps Maternal Grandfather   . Healthy Brother   . Colon cancer Neg Hx   . Breast cancer Neg Hx   . Ovarian cancer Neg Hx   . Cervical cancer Neg Hx     Social History Social History   Tobacco Use  . Smoking status: Current Every Day Smoker    Packs/day: 0.50    Years: 4.00    Pack years: 2.00    Types: Cigarettes  . Smokeless tobacco: Never Used  . Tobacco comment: started smoking in 2015  Substance  Use Topics  . Alcohol use: Yes    Comment: occasional, ~1x/ month  . Drug use: No     Review of Systems  Constitutional: No fever/chills Eyes: No visual changes. No discharge ENT: No upper respiratory complaints. Cardiovascular: no chest pain. Respiratory: no cough. No SOB. Gastrointestinal: No abdominal pain.  No nausea, no vomiting.  No diarrhea.  No constipation. Musculoskeletal: Negative for musculoskeletal pain. Skin: Negative for rash, abrasions, lacerations, ecchymosis. Neurological: Negative for headaches, focal weakness or numbness.   ____________________________________________   PHYSICAL EXAM:  VITAL SIGNS: ED Triage Vitals  Enc Vitals Group     BP 09/09/18 1654 137/82     Pulse Rate 09/09/18 1649 64     Resp 09/09/18 1649 17     Temp 09/09/18 1649 98.6 F (37 C)     Temp Source 09/09/18 1649 Oral     SpO2 09/09/18 1648 99 %     Weight 09/09/18 1650 185 lb (83.9 kg)     Height 09/09/18 1650 5\' 5"  (1.651 m)     Head Circumference --      Peak Flow --      Pain Score 09/09/18 1650 0     Pain Loc --      Pain Edu? --      Excl. in [place]? --      Constitutional:  Alert and oriented. Well appearing and in no acute distress. Eyes: Conjunctivae are normal. PERRL. EOMI. Head: Atraumatic.  Cardiovascular: Normal rate, regular rhythm. Normal S1 and S2.  Good peripheral circulation. Respiratory: Normal respiratory effort without tachypnea or retractions. Lungs CTAB. Good air entry to the bases with no decreased or absent breath sounds. Gastrointestinal: Bowel sounds 4 quadrants. Soft and nontender to palpation. No guarding or rigidity. No palpable masses. No distention. No CVA tenderness. Musculoskeletal: Full range of motion to all extremities. No gross deformities appreciated. Neurologic:  Normal speech and language. No gross focal neurologic deficits are appreciated.  Skin:  Skin is warm, dry and intact. No rash noted. Psychiatric: Mood and affect are normal.  Speech and behavior are normal. Patient exhibits appropriate insight and judgement.   ____________________________________________   LABS (all labs ordered are listed, but only abnormal results are displayed)  Labs Reviewed - No data to display ____________________________________________  EKG   ____________________________________________  RADIOLOGY  No results found.  ____________________________________________    PROCEDURES  Procedure(s) performed:    Procedures    Medications  rabies vaccine (RABAVERT) injection 1 mL (1 mL Intramuscular Given 09/09/18 1710)     ____________________________________________   INITIAL IMPRESSION / ASSESSMENT AND PLAN / ED COURSE  Pertinent labs & imaging results that were available during my care of the patient were reviewed by me and considered in my medical decision making (see chart for details).  Review of the [place] CSRS was performed in accordance of the [facility] prior to dispensing any controlled drugs.         Assessment and Plan:  Encounter for repeat administration of rabies vaccine 33 year old female presents to the emergency department for her third rabies vaccine.  Rabies vaccine was given and patient had no questions.  She anticipates returning to the emergency department for 7 days for her last rabies vaccine.  All patient questions were answered.   ____________________________________________  FINAL CLINICAL IMPRESSION(S) / ED DIAGNOSES  Final diagnoses:  Encounter for repeat administration of rabies vaccination      NEW MEDICATIONS STARTED DURING THIS VISIT:  ED Discharge Orders    None          This chart was dictated using voice recognition software/Dragon. Despite best efforts to proofread, errors can occur which can change the meaning. Any change was purely unintentional.    Orvil FeilWoods, Kynsie Falkner M, PA-C 09/09/18 1735    Phineas SemenGoodman, [place], MD 09/09/18 [phone]

## 2018-09-09 NOTE — ED Triage Notes (Signed)
Reports here for her third Rabies shot.

## 2018-09-16 ENCOUNTER — Emergency Department
Admission: EM | Admit: 2018-09-16 | Discharge: 2018-09-16 | Disposition: A | Discharge status: Home / Self Care | Payer: BC Managed Care – PPO | Attending: Emergency Medicine | Admitting: Emergency Medicine

## 2018-09-16 ENCOUNTER — Encounter: Payer: Self-pay | Admitting: Emergency Medicine

## 2018-09-16 ENCOUNTER — Other Ambulatory Visit: Payer: Self-pay

## 2018-09-16 DIAGNOSIS — F17210 Nicotine dependence, cigarettes, uncomplicated: Secondary | ICD-10-CM | POA: Diagnosis not present

## 2018-09-16 DIAGNOSIS — Z23 Encounter for immunization: Secondary | ICD-10-CM | POA: Diagnosis not present

## 2018-09-16 DIAGNOSIS — J45909 Unspecified asthma, uncomplicated: Secondary | ICD-10-CM | POA: Insufficient documentation

## 2018-09-16 DIAGNOSIS — Z203 Contact with and (suspected) exposure to rabies: Secondary | ICD-10-CM | POA: Insufficient documentation

## 2018-09-16 DIAGNOSIS — Z202 Contact with and (suspected) exposure to infections with a predominantly sexual mode of transmission: Secondary | ICD-10-CM | POA: Diagnosis not present

## 2018-09-16 MED ORDER — RABIES VACCINE, PCEC IM SUSR
1.0000 mL | Freq: Once | INTRAMUSCULAR | Status: AC
  Administered 2018-09-16: 1 mL via INTRAMUSCULAR
  Filled 2018-09-16: qty 1

## 2018-09-16 NOTE — ED Provider Notes (Signed)
[name] Emergency Department Provider Note  ____________________________________________  Time seen: Approximately 7:25 PM  I have reviewed the triage vital signs and the nursing notes.   HISTORY  Chief Complaint Rabies Injection    HPI Amanda Nichols is a 33 y.o. female who presents the emergency department for fourth and final rabies vaccination.  See original note from initial encounter from a cat bite.  Patient is had no complications to the wound.  No symptoms of rabies.  Here for her fourth and final vaccine.         Past Medical History:  Diagnosis Date  . Asthma   . Heart murmur     Patient Active Problem List   Diagnosis Date Noted  . Knee sprain 04/26/2017  . Exercise-induced asthma 04/26/2017  . Tobacco abuse 04/26/2017    Past Surgical History:  Procedure Laterality Date  . NO PAST SURGERIES      Prior to Admission medications   Medication Sig Start Date End Date Taking? Authorizing Provider  amoxicillin-clavulanate (AUGMENTIN) 875-125 MG tablet Take 1 tablet by mouth 2 (two) times daily. 09/02/18   Triplett, Cari B, FNP  ibuprofen (ADVIL,MOTRIN) 200 MG tablet Take 400 mg by mouth every 6 (six) hours as needed.    [provider]    Allergies Patient has no known allergies.  Family History  Problem Relation Age of Onset  . Supraventricular tachycardia Mother   . Hypertension Mother   . Stroke Father   . Sleep apnea Maternal Uncle   . Lymphoma Maternal Grandfather   . Melanoma Maternal Grandfather   . Cataracts Maternal Grandfather   . Colon polyps Maternal Grandfather   . Healthy Brother   . Colon cancer Neg Hx   . Breast cancer Neg Hx   . Ovarian cancer Neg Hx   . Cervical cancer Neg Hx     Social History Social History   Tobacco Use  . Smoking status: Current Every Day Smoker    Packs/day: 0.50    Years: 4.00    Pack years: 2.00    Types: Cigarettes  . Smokeless tobacco: Never Used  .  Tobacco comment: started smoking in 2015  Substance Use Topics  . Alcohol use: Yes    Comment: occasional, ~1x/ month  . Drug use: No     Review of Systems  Constitutional: No fever/chills Eyes: No visual changes. No discharge ENT: No upper respiratory complaints. Cardiovascular: no chest pain. Respiratory: no cough. No SOB. Gastrointestinal: No abdominal pain.  No nausea, no vomiting.  No diarrhea.  No constipation. Musculoskeletal: Negative for musculoskeletal pain. Skin: Negative for rash, abrasions, lacerations, ecchymosis. Neurological: Negative for headaches, focal weakness or numbness. 10-point ROS otherwise negative.  ____________________________________________   PHYSICAL EXAM:  VITAL SIGNS: ED Triage Vitals  Enc Vitals Group     BP 09/16/18 1825 126/77     Pulse Rate 09/16/18 1825 73     Resp 09/16/18 1825 18     Temp 09/16/18 1825 98.5 F (36.9 C)     Temp Source 09/16/18 1825 Oral     SpO2 09/16/18 1825 99 %     Weight 09/16/18 1822 185 lb (83.9 kg)     Height 09/16/18 1822 5\' 5"  (1.651 m)     Head Circumference --      Peak Flow --      Pain Score 09/16/18 1822 0     Pain Loc --      Pain Edu? --  Excl. in [place]? --      Constitutional: Alert and oriented. Well appearing and in no acute distress. Eyes: Conjunctivae are normal. PERRL. EOMI. Head: Atraumatic. ENT:      Ears:       Nose: No congestion/rhinnorhea.      Mouth/Throat: Mucous membranes are moist.  Neck: No stridor.  Neck supple full range of motion Hematological/Lymphatic/Immunilogical: No cervical lymphadenopathy. Cardiovascular: Normal rate, regular rhythm. Normal S1 and S2.  Good peripheral circulation. Respiratory: Normal respiratory effort without tachypnea or retractions. Lungs CTAB. Good air entry to the bases with no decreased or absent breath sounds. Musculoskeletal: Full range of motion to all extremities. No gross deformities appreciated. Neurologic:  Normal speech and  language. No gross focal neurologic deficits are appreciated.  Skin:  Skin is warm, dry and intact. No rash noted. Psychiatric: Mood and affect are normal. Speech and behavior are normal. Patient exhibits appropriate insight and judgement.   ____________________________________________   LABS (all labs ordered are listed, but only abnormal results are displayed)  Labs Reviewed - No data to display ____________________________________________  EKG   ____________________________________________  RADIOLOGY   No results found.  ____________________________________________    PROCEDURES  Procedure(s) performed:    Procedures    Medications  rabies vaccine (RABAVERT) injection 1 mL (1 mL Intramuscular Given 09/16/18 1914)     ____________________________________________   INITIAL IMPRESSION / ASSESSMENT AND PLAN / ED COURSE  Pertinent labs & imaging results that were available during my care of the patient were reviewed by me and considered in my medical decision making (see chart for details).  Review of the [place] CSRS was performed in accordance of the [place] prior to dispensing any controlled drugs.           Patient's diagnosis is consistent with need for rabies vaccine.  Patient presented to the emergency department for fourth and final rabies vaccine.  No complications from initial injury or rabies vaccine series.  Last vaccine is administered today.  Discharged with no prescriptions.  No need to follow-up for any additional rabies vaccine.  Follow-up primary care as needed..  Patient is given ED precautions to return to the ED for any worsening or new symptoms.     ____________________________________________  FINAL CLINICAL IMPRESSION(S) / ED DIAGNOSES  Final diagnoses:  Need for rabies vaccination      NEW MEDICATIONS STARTED DURING THIS VISIT:  ED Discharge Orders    None          This chart was dictated using voice recognition  software/Dragon. Despite best efforts to proofread, errors can occur which can change the meaning. Any change was purely unintentional.    Darletta Moll, PA-C 09/16/18 1929    Earleen Newport, MD 09/16/18 Karl Bales

## 2018-09-16 NOTE — ED Notes (Signed)
Pt here for rabies booster. Pt has no s/s and NAD at this time.

## 2018-09-16 NOTE — ED Notes (Signed)
Pt verbalized understanding of discharge instructions. NAD at this time. 

## 2018-09-16 NOTE — ED Triage Notes (Signed)
Pt presents to ED for final rabies injection.

## 2018-12-27 ENCOUNTER — Other Ambulatory Visit: Payer: Self-pay

## 2018-12-27 DIAGNOSIS — Z20822 Contact with and (suspected) exposure to covid-19: Secondary | ICD-10-CM

## 2018-12-28 ENCOUNTER — Encounter: Payer: Self-pay | Admitting: Family Medicine

## 2018-12-28 ENCOUNTER — Telehealth: Payer: BC Managed Care – PPO | Admitting: Nurse Practitioner

## 2018-12-28 DIAGNOSIS — R059 Cough, unspecified: Secondary | ICD-10-CM

## 2018-12-28 DIAGNOSIS — Z20828 Contact with and (suspected) exposure to other viral communicable diseases: Secondary | ICD-10-CM

## 2018-12-28 DIAGNOSIS — Z20822 Contact with and (suspected) exposure to covid-19: Secondary | ICD-10-CM

## 2018-12-28 DIAGNOSIS — J029 Acute pharyngitis, unspecified: Secondary | ICD-10-CM

## 2018-12-28 DIAGNOSIS — R05 Cough: Secondary | ICD-10-CM

## 2018-12-28 MED ORDER — BENZONATATE 100 MG PO CAPS: 100.0000 mg | ORAL_CAPSULE | Freq: Three times a day (TID) | ORAL | 0 refills | Status: DC | PRN

## 2018-12-28 NOTE — Progress Notes (Signed)
E-Visit for Corona Virus Screening   Your current symptoms could be consistent with the coronavirus.Looks like you were tested for corona virus yesterday, but results are not back yet.  Please quarantine yourself while awaiting your test results.  We are enrolling you in our [facility] for [place] . Daily you will receive a questionnaire within the [facility] website. Our COVID 19 response team willl be monitoriing your responses daily. Please continue good preventive care measures, including:  frequent hand-washing, avoid touching your face, cover coughs/sneezes, stay out of crowds and keep a 6 foot distance from others.    COVID-19 is a respiratory illness with symptoms that are similar to the flu. Symptoms are typically mild to moderate, but there have been cases of severe illness and death due to the virus. The following symptoms may appear 2-14 days after exposure: . Fever . Cough . Shortness of breath or difficulty breathing . Chills . Repeated shaking with chills . Muscle pain . Headache . Sore throat . New loss of taste or smell . Fatigue . Congestion or runny nose . Nausea or vomiting . Diarrhea  If you develop fever/cough/breathlessness, please stay home for 10 days with improving symptoms and until you have had 24 hours of no fever (without taking a fever reducer).  Go to the nearest hospital ED for assessment if fever/cough/breathlessness are severe or illness seems like a threat to life.  It is vitally important that if you feel that you have an infection such as this virus or any other virus that you stay home and away from places where you may spread it to others.  You should avoid contact with people age 31 and older.   You should wear a mask or cloth face covering over your nose and mouth if you must be around other people or animals, including pets (even at home). Try to stay at least 6 feet away from other people. This will protect the people around you.  You can  use medication such as A prescription cough medication called Tessalon Perles 100 mg. You may take 1-2 capsules every 8 hours as needed for cough  You may also take acetaminophen (Tylenol) as needed for fever.   Reduce your risk of any infection by using the same precautions used for avoiding the common cold or flu:  Marland Kitchen Wash your hands often with soap and warm water for at least 20 seconds.  If soap and water are not readily available, use an alcohol-based hand sanitizer with at least 60% alcohol.  . If coughing or sneezing, cover your mouth and nose by coughing or sneezing into the elbow areas of your shirt or coat, into a tissue or into your sleeve (not your hands). . Avoid shaking hands with others and consider head nods or verbal greetings only. . Avoid touching your eyes, nose, or mouth with unwashed hands.  . Avoid close contact with people who are sick. . Avoid places or events with large numbers of people in one location, like concerts or sporting events. . Carefully consider travel plans you have or are making. . If you are planning any travel outside or inside the [place], visit the CDC's Travelers' Health webpage for the latest health notices. . If you have some symptoms but not all symptoms, continue to monitor at home and seek medical attention if your symptoms worsen. . If you are having a medical emergency, call 911.  HOME CARE . Only take medications as instructed by your medical team. .  Drink plenty of fluids and get plenty of rest. . A steam or ultrasonic humidifier can help if you have congestion.   GET HELP RIGHT AWAY IF YOU HAVE EMERGENCY WARNING SIGNS** FOR COVID-19. If you or someone is showing any of these signs seek emergency medical care immediately. Call 911 or proceed to your closest emergency facility if: . You develop worsening high fever. . Trouble breathing . Bluish lips or face . Persistent pain or pressure in the chest . New confusion . Inability to wake or  stay awake . You cough up blood. . Your symptoms become more severe  **This list is not all possible symptoms. Contact your medical provider for any symptoms that are sever or concerning to you.   MAKE SURE YOU   Understand these instructions.  Will watch your condition.  Will get help right away if you are not doing well or get worse.  Your e-visit answers were reviewed by a board certified advanced clinical practitioner to complete your personal care plan.  Depending on the condition, your plan could have included both over the counter or prescription medications.  If there is a problem please reply once you have received a response from your provider.  Your safety is important to [place].  If you have drug allergies check your prescription carefully.    You can use MyChart to ask questions about today's visit, request a non-urgent call back, or ask for a work or school excuse for 24 hours related to this e-Visit. If it has been greater than 24 hours you will need to follow up with your provider, or enter a new e-Visit to address those concerns. You will get an e-mail in the next two days asking about your experience.  I hope that your e-visit has been valuable and will speed your recovery. Thank you for using e-visits.   5-10 minutes spent reviewing and documenting in chart.

## 2018-12-29 LAB — NOVEL CORONAVIRUS, NAA: SARS-CoV-2, NAA: NOT DETECTED

## 2019-05-03 ENCOUNTER — Telehealth: Payer: BC Managed Care – PPO

## 2019-05-03 ENCOUNTER — Ambulatory Visit (INDEPENDENT_AMBULATORY_CARE_PROVIDER_SITE_OTHER)
Admission: RE | Admit: 2019-05-03 | Discharge: 2019-05-03 | Disposition: A | Discharge status: Home / Self Care | Payer: Self-pay | Source: Ambulatory Visit

## 2019-05-03 ENCOUNTER — Telehealth: Payer: 59 | Admitting: Nurse Practitioner

## 2019-05-03 DIAGNOSIS — K591 Functional diarrhea: Secondary | ICD-10-CM | POA: Diagnosis not present

## 2019-05-03 DIAGNOSIS — R112 Nausea with vomiting, unspecified: Secondary | ICD-10-CM

## 2019-05-03 DIAGNOSIS — R197 Diarrhea, unspecified: Secondary | ICD-10-CM

## 2019-05-03 NOTE — Discharge Instructions (Addendum)
Go to the nearest urgent care for further evaluation.

## 2019-05-03 NOTE — Progress Notes (Signed)
We are sorry that you are not feeling well.  Here is how we plan to help!  Based on what you have shared with me it looks like you have Acute Infectious Diarrhea.  Most cases of acute diarrhea are due to infections with virus and bacteria and are self-limited conditions lasting less than 14 days.  For your symptoms you may take Imodium 2 mg tablets that are over the counter at your local pharmacy. Take two tablet now and then one after each loose stool up to 6 a day.  Antibiotics are not needed for most people with diarrhea.   HOME CARE  We recommend changing your diet to help with your symptoms for the next few days.  Drink plenty of fluids that contain water salt and sugar. Sports drinks such as Gatorade may help.   You may try broths, soups, bananas, applesauce, soft breads, mashed potatoes or crackers.   You are considered infectious for as long as the diarrhea continues. Hand washing or use of alcohol based hand sanitizers is recommend.  It is best to stay out of work or school until your symptoms stop.   GET HELP RIGHT AWAY  If you have dark yellow colored urine or do not pass urine frequently you should drink more fluids.    If your symptoms worsen   If you feel like you are going to pass out (faint)  You have a new problem  MAKE SURE YOU   Understand these instructions.  Will watch your condition.  Will get help right away if you are not doing well or get worse.  Your e-visit answers were reviewed by a board certified advanced clinical practitioner to complete your personal care plan.  Depending on the condition, your plan could have included both over the counter or prescription medications.  If there is a problem please reply  once you have received a response from your provider.  Your safety is important to us.  If you have drug allergies check your prescription carefully.    You can use MyChart to ask questions about today's visit, request a non-urgent call  back, or ask for a work or school excuse for 24 hours related to this e-Visit. If it has been greater than 24 hours you will need to follow up with your provider, or enter a new e-Visit to address those concerns.   You will get an e-mail in the next two days asking about your experience.  I hope that your e-visit has been valuable and will speed your recovery. Thank you for using e-visits.  5-10 minutes spent reviewing and documenting in chart.  

## 2019-05-03 NOTE — ED Provider Notes (Signed)
Virtual Visit via Video Note:  Margurette Brener  initiated request for Telemedicine visit with [facility] Urgent Care team. I connected with Driscilla Moats  on 05/03/2019 at 3:22 PM  for a synchronized telemedicine visit using a video enabled HIPPA compliant telemedicine application. I verified that I am speaking with Driscilla Moats  using two identifiers. Durward Parcel, FNP  was physically located in a [facility] Urgent care site and Jazariah Teall was located at a different location.   The limitations of evaluation and management by telemedicine as well as the availability of in-person appointments were discussed. Patient was informed that she  may incur a bill ( including co-pay) for this virtual visit encounter. Driscilla Moats  expressed understanding and gave verbal consent to proceed with virtual visit.     History of Present Illness:Amanda Nichols  is a 34 y.o. female with Hx of IBD presents via telehealth with a complaint of nausea, vomiting, diarrhea for the past few days.  Denies a precipitating event.  Patient localizes pain to generalized abdomen.  Describes it as constant and achy in character.  Has not tried any medication.  Reports symptom has been getting worse. Denies fever, chills, appetite change, weight change, chest pain,  changes in bowel or bladder habits.  Past Medical History:  Diagnosis Date  . Asthma   . Heart murmur     No Known Allergies      Observations/Objective: VITALS: Per patient if applicable, see vitals. GENERAL: Alert, appears well and in no acute distress. HEENT: Atraumatic, conjunctiva clear, no obvious abnormalities on inspection of external nose and ears. NECK: Normal movements of the head and neck. CARDIOPULMONARY: No increased WOB. Speaking in clear sentences. I:E ratio WNL.  MS: Moves all visible extremities without noticeable abnormality. PSYCH: Pleasant and cooperative, well-groomed. Speech normal rate  and rhythm. Affect is appropriate. Insight and judgement are appropriate. Attention is focused, linear, and appropriate.  NEURO: CN grossly intact. Oriented as arrived to appointment on time with no prompting. Moves both UE equally.  SKIN: No obvious lesions, wounds, erythema, or cyanosis noted on face or hands.    Assessment and Plan:   ICD-10-CM   1. Nausea and vomiting in adult patient  R11.2   2. Diarrhea, unspecified type  R19.7     Follow Up Instructions: Go to the nearest urgent care for further evaluation   I discussed the assessment and treatment plan with the patient. The patient was provided an opportunity to ask questions and all were answered. The patient agreed with the plan and demonstrated an understanding of the instructions.   The patient was advised to call back or seek an in-person evaluation if the symptoms worsen or if the condition fails to improve as anticipated.  I provided 15 minutes of non-face-to-face time during this encounter.    Durward Parcel, FNP  05/03/2019 3:35 PM         Durward Parcel, FNP 05/03/19 1535

## 2020-02-07 ENCOUNTER — Emergency Department: Admission: EM | Admit: 2020-02-07 | Discharge: 2020-02-07 | Discharge status: Against Medical Advice | Payer: 59

## 2021-07-14 ENCOUNTER — Ambulatory Visit: Payer: Self-pay | Admitting: Physician Assistant

## 2022-11-04 NOTE — Progress Notes (Unsigned)
New patient visit  Patient: Amanda Nichols   DOB: 01/10/1986   37 y.o. Female  MRN: [id number] Visit Date: 11/05/2022  Today's healthcare provider: Debera Lat, PA-C   No chief complaint on file.  Subjective    Amanda Nichols is a 37 y.o. female who presents today as a new patient to establish care.  HPI  *** Discussed the use of AI scribe software for clinical note transcription with the patient, who gave verbal consent to proceed.  History of Present Illness            Past Medical History:  Diagnosis Date   Asthma    Heart murmur    Past Surgical History:  Procedure Laterality Date   NO PAST SURGERIES     Family Status  Relation Name Status   Mother  Alive   Father  Alive   Mat Uncle  (Not Specified)   MGF  (Not Specified)   Brother  Alive   Neg Hx  (Not Specified)  No partnership data on file   Family History  Problem Relation Age of Onset   Supraventricular tachycardia Mother    Hypertension Mother    Stroke Father    Sleep apnea Maternal Uncle    Lymphoma Maternal Grandfather    Melanoma Maternal Grandfather    Cataracts Maternal Grandfather    Colon polyps Maternal Grandfather    Healthy Brother    Colon cancer Neg Hx    Breast cancer Neg Hx    Ovarian cancer Neg Hx    Cervical cancer Neg Hx    Social History   Socioeconomic History   Marital status: Single    Spouse name: Not on file   Number of children: 0   Years of education: 16   Highest education level: Bachelor's degree (e.g., BA, AB, BS)  Occupational History   Occupation: Tourist information centre manager: [employer]  Tobacco Use   Smoking status: Every Day    Current packs/day: 0.50    Average packs/day: 0.5 packs/day for 4.0 years (2.0 ttl pk-yrs)    Types: Cigarettes   Smokeless tobacco: Never   Tobacco comments:    started smoking in 2015  Vaping Use   Vaping status: Never Used  Substance and Sexual Activity   Alcohol use: Yes     Comment: occasional, ~1x/ month   Drug use: No   Sexual activity: Not Currently    Birth control/protection: None  Other Topics Concern   Not on file  Social History Narrative   Not on file   Social Determinants of Health   Financial Resource Strain: Low Risk  (10/29/2022)   Overall Financial Resource Strain (CARDIA)    Difficulty of Paying Living Expenses: Not very hard  Food Insecurity: No Food Insecurity (10/29/2022)   Hunger Vital Sign    Worried About Running Out of Food in the Last Year: Never true    Ran Out of Food in the Last Year: Never true  Transportation Needs: No Transportation Needs (10/29/2022)   PRAPARE - Administrator, Civil Service (Medical): No    Lack of Transportation (Non-Medical): No  Physical Activity: Sufficiently Active (10/29/2022)   Exercise Vital Sign    Days of Exercise per Week: 3 days    Minutes of Exercise per Session: 60 min  Stress: No Stress Concern Present (10/29/2022)   [employer] of Occupational Health - Occupational Stress Questionnaire    Feeling of Stress : Only  a little  Social Connections: Moderately Integrated (10/29/2022)   Social Connection and Isolation Panel [NHANES]    Frequency of Communication with Friends and Family: More than three times a week    Frequency of Social Gatherings with Friends and Family: Once a week    Attends Religious Services: 1 to 4 times per year    Active Member of [employer] or Organizations: Yes    Attends Banker Meetings: More than 4 times per year    Marital Status: Widowed   Outpatient Medications Prior to Visit  Medication Sig   amoxicillin-clavulanate (AUGMENTIN) 875-125 MG tablet Take 1 tablet by mouth 2 (two) times daily.   benzonatate (TESSALON PERLES) 100 MG capsule Take 1 capsule (100 mg total) by mouth 3 (three) times daily as needed.   ibuprofen (ADVIL,MOTRIN) 200 MG tablet Take 400 mg by mouth every 6 (six) hours as needed.   No facility-administered  medications prior to visit.   No Known Allergies  Immunization History  Administered Date(s) Administered   Rabies, IM 09/02/2018, 09/05/2018, 09/09/2018, 09/16/2018    Health Maintenance  Topic Date Due   HIV Screening  Never done   Hepatitis C Screening  Never done   DTaP/Tdap/Td (1 - Tdap) Never done   Cervical Cancer Screening (HPV/Pap Cotest)  Never done   INFLUENZA VACCINE  Never done   COVID-19 Vaccine (1 - 2023-24 season) Never done   HPV VACCINES  Aged Out    Patient Care Team: Erasmo Downer, MD as PCP - General (Family Medicine)  Review of Systems  All other systems reviewed and are negative.  Except see HPI   {Insert previous labs (optional):23779} {See past labs  Heme  Chem  Endocrine  Serology  Results Review (optional):1}   Objective    There were no vitals taken for this visit. {Insert last BP/Wt (optional):23777}{See vitals history (optional):1}   Physical Exam Vitals reviewed.  Constitutional:      General: She is not in acute distress.    Appearance: Normal appearance. She is well-developed. She is not diaphoretic.  HENT:     Head: Normocephalic and atraumatic.  Eyes:     General: No scleral icterus.    Conjunctiva/sclera: Conjunctivae normal.  Neck:     Thyroid: No thyromegaly.  Cardiovascular:     Rate and Rhythm: Normal rate and regular rhythm.     Pulses: Normal pulses.     Heart sounds: Normal heart sounds. No murmur heard. Pulmonary:     Effort: Pulmonary effort is normal. No respiratory distress.     Breath sounds: Normal breath sounds. No wheezing, rhonchi or rales.  Musculoskeletal:     Cervical back: Neck supple.     Right lower leg: No edema.     Left lower leg: No edema.  Lymphadenopathy:     Cervical: No cervical adenopathy.  Skin:    General: Skin is warm and dry.     Findings: No rash.  Neurological:     Mental Status: She is alert and oriented to person, place, and time. Mental status is at baseline.   Psychiatric:        Mood and Affect: Mood normal.        Behavior: Behavior normal.     Depression Screen    04/26/2017    9:04 AM  PHQ 2/9 Scores  PHQ - 2 Score 0   No results found for any visits on 11/05/22.  Assessment & Plan     *** Assessment  and Plan              Encounter to establish care Welcomed to our clinic Reviewed past medical hx, social hx, family hx and surgical hx Pt advised to send all vaccination records or screening   No follow-ups on file.    The patient was advised to call back or seek an in-person evaluation if the symptoms worsen or if the condition fails to improve as anticipated.  I discussed the assessment and treatment plan with the patient. The patient was provided an opportunity to ask questions and all were answered. The patient agreed with the plan and demonstrated an understanding of the instructions.  I, Debera Lat, PA-C have reviewed all documentation for this visit. The documentation on  11/05/22  for the exam, diagnosis, procedures, and orders are all accurate and complete.  Debera Lat, [facility], MMS [facility] [phone] (phone) [phone] (fax)  [facility]

## 2022-11-05 ENCOUNTER — Ambulatory Visit: Payer: 59 | Admitting: Physician Assistant

## 2022-11-05 ENCOUNTER — Encounter: Payer: Self-pay | Admitting: Physician Assistant

## 2022-11-05 VITALS — BP 115/76 | HR 83 | Temp 97.8°F | Ht 65.0 in | Wt 194.2 lb

## 2022-11-05 DIAGNOSIS — Z6832 Body mass index (BMI) 32.0-32.9, adult: Secondary | ICD-10-CM

## 2022-11-05 DIAGNOSIS — R5383 Other fatigue: Secondary | ICD-10-CM | POA: Diagnosis not present

## 2022-11-05 DIAGNOSIS — Z114 Encounter for screening for human immunodeficiency virus [HIV]: Secondary | ICD-10-CM

## 2022-11-05 DIAGNOSIS — Z9889 Other specified postprocedural states: Secondary | ICD-10-CM

## 2022-11-05 DIAGNOSIS — E669 Obesity, unspecified: Secondary | ICD-10-CM | POA: Diagnosis not present

## 2022-11-05 DIAGNOSIS — Z136 Encounter for screening for cardiovascular disorders: Secondary | ICD-10-CM

## 2022-11-05 DIAGNOSIS — Z1159 Encounter for screening for other viral diseases: Secondary | ICD-10-CM

## 2022-11-05 DIAGNOSIS — Z833 Family history of diabetes mellitus: Secondary | ICD-10-CM | POA: Diagnosis not present

## 2022-11-05 DIAGNOSIS — Z7689 Persons encountering health services in other specified circumstances: Secondary | ICD-10-CM

## 2022-11-07 LAB — CBC WITH DIFFERENTIAL/PLATELET
Basophils Absolute: 0.1 10*3/uL (ref 0.0–0.2)
Basos: 1 %
EOS (ABSOLUTE): 0.4 10*3/uL (ref 0.0–0.4)
Eos: 5 %
Hematocrit: 40.7 % (ref 34.0–46.6)
Hemoglobin: 13.2 g/dL (ref 11.1–15.9)
Immature Grans (Abs): 0 10*3/uL (ref 0.0–0.1)
Immature Granulocytes: 0 %
Lymphocytes Absolute: 2.7 10*3/uL (ref 0.7–3.1)
Lymphs: 34 %
MCH: 28.1 pg (ref 26.6–33.0)
MCHC: 32.4 g/dL (ref 31.5–35.7)
MCV: 87 fL (ref 79–97)
Monocytes Absolute: 0.8 10*3/uL (ref 0.1–0.9)
Monocytes: 10 %
Neutrophils Absolute: 4.1 10*3/uL (ref 1.4–7.0)
Neutrophils: 50 %
Platelets: 370 10*3/uL (ref 150–450)
RBC: 4.69 x10E6/uL (ref 3.77–5.28)
RDW: 13.5 % (ref 11.7–15.4)
WBC: 8 10*3/uL (ref 3.4–10.8)

## 2022-11-07 LAB — COMPREHENSIVE METABOLIC PANEL
ALT: 9 [IU]/L (ref 0–32)
AST: 17 [IU]/L (ref 0–40)
Albumin: 4.3 g/dL (ref 3.9–4.9)
Alkaline Phosphatase: 64 [IU]/L (ref 44–121)
BUN/Creatinine Ratio: 12 (ref 9–23)
BUN: 12 mg/dL (ref 6–20)
Bilirubin Total: 0.5 mg/dL (ref 0.0–1.2)
CO2: 22 mmol/L (ref 20–29)
Calcium: 9.5 mg/dL (ref 8.7–10.2)
Chloride: 104 mmol/L (ref 96–106)
Creatinine, Ser: 1.01 mg/dL — ABNORMAL HIGH (ref 0.57–1.00)
Globulin, Total: 2.6 g/dL (ref 1.5–4.5)
Glucose: 80 mg/dL (ref 70–99)
Potassium: 4.5 mmol/L (ref 3.5–5.2)
Sodium: 140 mmol/L (ref 134–144)
Total Protein: 6.9 g/dL (ref 6.0–8.5)
eGFR: 74 mL/min/{1.73_m2} (ref >59)

## 2022-11-07 LAB — HEMOGLOBIN A1C
Est. average glucose Bld gHb Est-mCnc: 120 mg/dL
Hgb A1c MFr Bld: 5.8 % — ABNORMAL HIGH (ref 4.8–5.6)

## 2022-11-07 LAB — TSH: TSH: 0.969 u[IU]/mL (ref 0.450–4.500)

## 2022-12-02 NOTE — Progress Notes (Deleted)
New patient visit  Patient: Amanda Nichols   DOB: 11-02-1985   37 y.o. Female  MRN: [id number] Visit Date: 12/03/2022  Today's healthcare provider: Debera Lat, PA-C   No chief complaint on file.  Subjective    Amanda Nichols is a 37 y.o. female who presents today as a new patient to establish care.  HPI  *** Discussed the use of AI scribe software for clinical note transcription with the patient, who gave verbal consent to proceed.  History of Present Illness            Past Medical History:  Diagnosis Date   Asthma    Heart murmur    Past Surgical History:  Procedure Laterality Date   NO PAST SURGERIES     Family Status  Relation Name Status   Mother  Alive   Father  Alive   Mat Uncle  (Not Specified)   MGF  (Not Specified)   Brother  Alive   Neg Hx  (Not Specified)  No partnership data on file   Family History  Problem Relation Age of Onset   Supraventricular tachycardia Mother    Hypertension Mother    Stroke Father    Sleep apnea Maternal Uncle    Lymphoma Maternal Grandfather    Melanoma Maternal Grandfather    Cataracts Maternal Grandfather    Colon polyps Maternal Grandfather    Healthy Brother    Colon cancer Neg Hx    Breast cancer Neg Hx    Ovarian cancer Neg Hx    Cervical cancer Neg Hx    Social History   Socioeconomic History   Marital status: Single    Spouse name: Not on file   Number of children: 0   Years of education: 16   Highest education level: Bachelor's degree (e.g., BA, AB, BS)  Occupational History   Occupation: Tourist information centre manager: [employer]  Tobacco Use   Smoking status: Every Day    Current packs/day: 0.50    Average packs/day: 0.5 packs/day for 4.0 years (2.0 ttl pk-yrs)    Types: Cigarettes   Smokeless tobacco: Never   Tobacco comments:    started smoking in 2015  Vaping Use   Vaping status: Never Used  Substance and Sexual Activity   Alcohol use: Yes     Comment: occasional, ~1x/ month   Drug use: No   Sexual activity: Not Currently    Birth control/protection: None  Other Topics Concern   Not on file  Social History Narrative   Not on file   Social Determinants of Health   Financial Resource Strain: Low Risk  (10/29/2022)   Overall Financial Resource Strain (CARDIA)    Difficulty of Paying Living Expenses: Not very hard  Food Insecurity: No Food Insecurity (10/29/2022)   Hunger Vital Sign    Worried About Running Out of Food in the Last Year: Never true    Ran Out of Food in the Last Year: Never true  Transportation Needs: No Transportation Needs (10/29/2022)   PRAPARE - Administrator, Civil Service (Medical): No    Lack of Transportation (Non-Medical): No  Physical Activity: Sufficiently Active (10/29/2022)   Exercise Vital Sign    Days of Exercise per Week: 3 days    Minutes of Exercise per Session: 60 min  Stress: No Stress Concern Present (10/29/2022)   [employer] of Occupational Health - Occupational Stress Questionnaire    Feeling of Stress : Only  a little  Social Connections: Moderately Integrated (10/29/2022)   Social Connection and Isolation Panel [NHANES]    Frequency of Communication with Friends and Family: More than three times a week    Frequency of Social Gatherings with Friends and Family: Once a week    Attends Religious Services: 1 to 4 times per year    Active Member of [employer] or Organizations: Yes    Attends Banker Meetings: More than 4 times per year    Marital Status: Widowed   No outpatient medications prior to visit.   No facility-administered medications prior to visit.   No Known Allergies  Immunization History  Administered Date(s) Administered   Rabies, IM 09/02/2018, 09/05/2018, 09/09/2018, 09/16/2018    Health Maintenance  Topic Date Due   HIV Screening  Never done   Hepatitis C Screening  Never done   DTaP/Tdap/Td (1 - Tdap) Never done   Cervical  Cancer Screening (HPV/Pap Cotest)  Never done   COVID-19 Vaccine (1 - 2023-24 season) Never done   INFLUENZA VACCINE  04/19/2023 (Originally 08/20/2022)   HPV VACCINES  Aged Out    Patient Care Team: Debera Lat, PA-C as PCP - General (Physician Assistant)  Review of Systems  All other systems reviewed and are negative.  Except see HPI   {Insert previous labs (optional):23779} {See past labs  Heme  Chem  Endocrine  Serology  Results Review (optional):1}   Objective    There were no vitals taken for this visit. {Insert last BP/Wt (optional):23777}{See vitals history (optional):1}   Physical Exam Vitals reviewed.  Constitutional:      General: She is not in acute distress.    Appearance: Normal appearance. She is well-developed. She is not diaphoretic.  HENT:     Head: Normocephalic and atraumatic.  Eyes:     General: No scleral icterus.    Conjunctiva/sclera: Conjunctivae normal.  Neck:     Thyroid: No thyromegaly.  Cardiovascular:     Rate and Rhythm: Normal rate and regular rhythm.     Pulses: Normal pulses.     Heart sounds: Normal heart sounds. No murmur heard. Pulmonary:     Effort: Pulmonary effort is normal. No respiratory distress.     Breath sounds: Normal breath sounds. No wheezing, rhonchi or rales.  Musculoskeletal:     Cervical back: Neck supple.     Right lower leg: No edema.     Left lower leg: No edema.  Lymphadenopathy:     Cervical: No cervical adenopathy.  Skin:    General: Skin is warm and dry.     Findings: No rash.  Neurological:     Mental Status: She is alert and oriented to person, place, and time. Mental status is at baseline.  Psychiatric:        Mood and Affect: Mood normal.        Behavior: Behavior normal.     Depression Screen    11/05/2022    2:14 PM 04/26/2017    9:04 AM  PHQ 2/9 Scores  PHQ - 2 Score 0 0   No results found for any visits on 12/03/22.  Assessment & Plan       Encounter to establish  care Welcomed to our clinic Reviewed past medical hx, social hx, family hx and surgical hx Pt advised to send all vaccination records or screening   No follow-ups on file.    The patient was advised to call back or seek an in-person evaluation if the  symptoms worsen or if the condition fails to improve as anticipated.  I discussed the assessment and treatment plan with the patient. The patient was provided an opportunity to ask questions and all were answered. The patient agreed with the plan and demonstrated an understanding of the instructions.  I, Debera Lat, PA-C have reviewed all documentation for this visit. The documentation on  12/03/22 for the exam, diagnosis, procedures, and orders are all accurate and complete.  Debera Lat, [facility], MMS [facility] [phone] (phone) [phone] (fax)  [facility]

## 2022-12-03 ENCOUNTER — Ambulatory Visit: Payer: 59 | Admitting: Physician Assistant

## 2022-12-03 ENCOUNTER — Encounter: Payer: 59 | Admitting: Physician Assistant

## 2022-12-03 DIAGNOSIS — Z91199 Patient's noncompliance with other medical treatment and regimen due to unspecified reason: Secondary | ICD-10-CM

## 2022-12-03 NOTE — Progress Notes (Signed)
Patient was not seen for appt d/t no call, no show, or late arrival >10 mins past appt time.    Debera Lat PA [facility] [address] [place], [place] [address] [phone] (phone) [phone] (fax) [facility]
